# Patient Record
Sex: Female | Born: 1940 | Race: White | Hispanic: No | Marital: Married | Smoking: Former smoker
Health system: Southern US, Community
[De-identification: ages and names within clinical notes are randomized; demographics above are authoritative.]

## PROBLEM LIST (undated history)

## (undated) DIAGNOSIS — R079 Chest pain, unspecified: Secondary | ICD-10-CM

## (undated) DIAGNOSIS — D72820 Lymphocytosis (symptomatic): Secondary | ICD-10-CM

## (undated) DIAGNOSIS — D473 Essential (hemorrhagic) thrombocythemia: Secondary | ICD-10-CM

## (undated) DIAGNOSIS — IMO0002 Reserved for concepts with insufficient information to code with codable children: Secondary | ICD-10-CM

## (undated) DIAGNOSIS — I10 Essential (primary) hypertension: Secondary | ICD-10-CM

## (undated) DIAGNOSIS — E785 Hyperlipidemia, unspecified: Secondary | ICD-10-CM

## (undated) HISTORY — DX: Chest pain, unspecified: R07.9

## (undated) HISTORY — DX: Lymphocytosis (symptomatic): D72.820

## (undated) HISTORY — DX: Hyperlipidemia, unspecified: E78.5

## (undated) HISTORY — DX: Essential (primary) hypertension: I10

## (undated) HISTORY — DX: Reserved for concepts with insufficient information to code with codable children: IMO0002

## (undated) HISTORY — DX: Essential (hemorrhagic) thrombocythemia: D47.3

---

## 1960-02-04 HISTORY — PX: CHOLECYSTECTOMY: SHX55

## 1976-02-04 HISTORY — PX: VESICOVAGINAL FISTULA CLOSURE W/ TAH: SUR271

## 1999-03-15 ENCOUNTER — Ambulatory Visit (HOSPITAL_COMMUNITY): Admission: RE | Admit: 1999-03-15 | Discharge: 1999-03-15 | Payer: Self-pay | Admitting: Gastroenterology

## 1999-12-18 ENCOUNTER — Other Ambulatory Visit: Admission: RE | Admit: 1999-12-18 | Discharge: 1999-12-18 | Payer: Self-pay | Admitting: *Deleted

## 2001-06-22 ENCOUNTER — Other Ambulatory Visit: Admission: RE | Admit: 2001-06-22 | Discharge: 2001-06-22 | Payer: Self-pay | Admitting: *Deleted

## 2003-09-07 ENCOUNTER — Encounter: Admission: RE | Admit: 2003-09-07 | Discharge: 2003-09-07 | Payer: Self-pay | Admitting: Family Medicine

## 2004-03-25 ENCOUNTER — Ambulatory Visit (HOSPITAL_COMMUNITY): Admission: RE | Admit: 2004-03-25 | Discharge: 2004-03-25 | Payer: Self-pay | Admitting: Gastroenterology

## 2004-03-25 ENCOUNTER — Encounter (INDEPENDENT_AMBULATORY_CARE_PROVIDER_SITE_OTHER): Payer: Self-pay | Admitting: *Deleted

## 2006-07-08 ENCOUNTER — Ambulatory Visit: Payer: Self-pay | Admitting: Oncology

## 2006-08-26 ENCOUNTER — Ambulatory Visit: Payer: Self-pay | Admitting: Oncology

## 2006-08-27 LAB — COMPREHENSIVE METABOLIC PANEL
Alkaline Phosphatase: 69 U/L (ref 39–117)
BUN: 6 mg/dL (ref 6–23)
CO2: 24 mEq/L (ref 19–32)
Calcium: 9 mg/dL (ref 8.4–10.5)
Glucose, Bld: 105 mg/dL — ABNORMAL HIGH (ref 70–99)
Total Bilirubin: 0.6 mg/dL (ref 0.3–1.2)

## 2006-08-27 LAB — CBC & DIFF AND RETIC
BASO%: 1.2 % (ref 0.0–2.0)
LYMPH%: 36.1 % (ref 14.0–48.0)
MCV: 82 fL (ref 81.0–101.0)
MONO%: 6.3 % (ref 0.0–13.0)
NEUT#: 4.7 10*3/uL (ref 1.5–6.5)
Platelets: 396 10*3/uL (ref 145–400)
RDW: 10.9 % — ABNORMAL LOW (ref 11.3–14.5)
WBC: 8.6 10*3/uL (ref 3.9–10.0)

## 2006-08-27 LAB — CHCC SMEAR

## 2006-11-24 ENCOUNTER — Ambulatory Visit: Payer: Self-pay | Admitting: Oncology

## 2006-11-26 LAB — CBC WITH DIFFERENTIAL/PLATELET
Basophils Absolute: 0.1 10*3/uL (ref 0.0–0.1)
EOS%: 1.7 % (ref 0.0–7.0)
Eosinophils Absolute: 0.1 10*3/uL (ref 0.0–0.5)
HGB: 13.4 g/dL (ref 11.6–15.9)
LYMPH%: 37.5 % (ref 14.0–48.0)
MCH: 29.9 pg (ref 26.0–34.0)
MCHC: 35.8 g/dL (ref 32.0–36.0)
MONO#: 0.4 10*3/uL (ref 0.1–0.9)
MONO%: 5.8 % (ref 0.0–13.0)
NEUT#: 4.2 10*3/uL (ref 1.5–6.5)
RDW: 11.1 % — ABNORMAL LOW (ref 11.3–14.5)
WBC: 7.7 10*3/uL (ref 3.9–10.0)
lymph#: 2.9 10*3/uL (ref 0.9–3.3)

## 2006-11-30 LAB — COMPREHENSIVE METABOLIC PANEL
ALT: 15 U/L (ref 0–35)
Albumin: 4.3 g/dL (ref 3.5–5.2)
Creatinine, Ser: 0.72 mg/dL (ref 0.40–1.20)
Sodium: 138 mEq/L (ref 135–145)
Total Protein: 6.7 g/dL (ref 6.0–8.3)

## 2006-11-30 LAB — LACTATE DEHYDROGENASE: LDH: 132 U/L (ref 94–250)

## 2007-05-06 ENCOUNTER — Ambulatory Visit: Payer: Self-pay | Admitting: Oncology

## 2007-05-10 LAB — CBC WITH DIFFERENTIAL/PLATELET
BASO%: 0.6 % (ref 0.0–2.0)
EOS%: 2.5 % (ref 0.0–7.0)
HCT: UNDETERMINED % (ref 34.8–46.6)
HGB: 14.2 g/dL (ref 11.6–15.9)
LYMPH%: 32.1 % (ref 14.0–48.0)
MONO%: 8.5 % (ref 0.0–13.0)
NEUT%: 56.3 % (ref 39.6–76.8)
Platelets: 395 10*3/uL (ref 145–400)
RBC: UNDETERMINED 10*6/uL (ref 3.70–5.32)
RDW: UNDETERMINED % (ref 11.3–14.5)
WBC: 8.1 10*3/uL (ref 3.9–10.0)
lymph#: 2.6 10*3/uL (ref 0.9–3.3)

## 2010-05-21 ENCOUNTER — Institutional Professional Consult (permissible substitution): Payer: Self-pay | Admitting: Internal Medicine

## 2010-05-27 ENCOUNTER — Encounter: Payer: Self-pay | Admitting: Internal Medicine

## 2010-05-27 ENCOUNTER — Ambulatory Visit (INDEPENDENT_AMBULATORY_CARE_PROVIDER_SITE_OTHER): Payer: Medicare Other | Admitting: Internal Medicine

## 2010-05-27 VITALS — BP 120/74 | HR 72 | Temp 97.7°F | Ht 63.5 in | Wt 168.0 lb

## 2010-05-27 DIAGNOSIS — R0600 Dyspnea, unspecified: Secondary | ICD-10-CM

## 2010-05-27 DIAGNOSIS — R0989 Other specified symptoms and signs involving the circulatory and respiratory systems: Secondary | ICD-10-CM

## 2010-05-27 DIAGNOSIS — R0609 Other forms of dyspnea: Secondary | ICD-10-CM

## 2010-05-27 NOTE — Progress Notes (Signed)
  Subjective:    Patient ID: Cindy Keller, female    DOB: 01/03/1941, 70 y.o.   MRN: [id number]  HPI  45 yowf quit smoking 2011 with no resp problems (but not aerobically active then)  eval by Dr Anne Fu for doe/chest tightness and found to have hbp otherwise healthy and referred to pulmonary 05/27/2010 .   05/27/2010 Initial pulmonary/ Aairah Negrette  office eval with h/o sneezing nasal congestion x decades with good activity intolerance until spring of 2012 when started walking track for the first time early  in March 2012 assoc with chest tightness also assoc with new doe steps which she also noticed with the outdoor breathing difficulty and  curtailed the outdoor activity and   steps since onset of symptoms so "feeling better" on day of initial ov.  Chest tightness is also gone now that not as active,   Assoc with hoarsenss.  Ace started after onset of hoarseness.  Takes generic otcs antihistamines seem to help with pnds but no worse than usual symptoms.   Pt denies any significant sore throat, dysphagia, itching, sneezing,  nasal congestion or excess/ purulent secretions,  fever, chills, sweats, unintended wt loss, pleuritic or exertional cp, hempoptysis, orthopnea pnd or leg swelling.    Also denies any obvious fluctuation of symptoms with weather or environmental changes or other aggravating or alleviating factors.         Review of Systems  Constitutional: Negative for fever, chills and unexpected weight change.  HENT: Negative for ear pain, nosebleeds, congestion, sore throat, rhinorrhea, sneezing, trouble swallowing, dental problem, voice change, postnasal drip and sinus pressure.   Eyes: Negative for visual disturbance.  Respiratory: Positive for shortness of breath. Negative for cough and choking.   Cardiovascular: Negative for chest pain and leg swelling.  Gastrointestinal: Negative for vomiting, abdominal pain and diarrhea.  Genitourinary: Negative for difficulty urinating.    Musculoskeletal: Negative for arthralgias.  Skin: Negative for rash.  Neurological: Negative for tremors, syncope and headaches.  Hematological: Does not bruise/bleed easily.       Objective:   Physical Exam Pleasant amb wf nad  Wt 168 05/27/2010  HEENT mild turbinate edema.  Oropharynx no thrush or excess pnd or cobblestoning.  No JVD or cervical adenopathy. Mild accessory muscle hypertrophy. Trachea midline, nl thryroid. Chest was hyperinflated by percussion with diminished breath sounds and minimally increased exp time without wheeze. Hoover sign positive at very lated inspiration. Regular rate and rhythm without murmur gallop or rub or increase P2 or edema.  Abd: no hsm, nl excursion. Ext warm without cyanosis or clubbing.         Assessment & Plan:

## 2010-05-27 NOTE — Assessment & Plan Note (Addendum)
  When respiratory symptoms begin well after a patient reports complete smoking cessation,  it is very hard to "blame" COPD specifically or airways disorders in general  ie it doesn't make any more sense than hearing a  [employer] driver wrecked his car while driving his kids to school or a surgeon sliced his hand off carving roast beef (it must be rare indeed!)     That is to say, once the high risk activity stops,  the symptoms should not suddenly erupt or markedly worsen.  If so, the differential diagnosis should include  obesity/deconditioning,  LPR/Reflux/Aspiration syndromes,  occult CHF, or  especially side effect of medications commonly used in this population such as ACEI  Which would be a tempting dx x the ace was added after cards eval was complete and here symptoms are some better since then (although note she's not as active/ certainly the hoarseness is no worse).  For now rx with gerd diet and add ppi if not better then return for pft's

## 2010-05-27 NOTE — Patient Instructions (Addendum)
GERD (REFLUX)  is an extremely common cause of respiratory symptoms, many times with no significant heartburn at all.    It can be treated with medication, but also with lifestyle changes including avoidance of late meals, excessive alcohol, smoking cessation, and avoid fatty foods, chocolate, peppermint, colas, red wine, and acidic juices such as orange juice.  NO MINT OR MENTHOL PRODUCTS SO NO COUGH DROPS  USE SUGARLESS CANDY INSTEAD (jolley ranchers or Stover's)  NO OIL BASED VITAMINS   If your symptom worse go ahead and start Prilosec 20 mg Take 30-60 min before first meal of the day  And zyrtec 10 mg at bedtime   Please schedule a follow up office visit in 2 -4  weeks, sooner if needed with PFT's on return

## 2010-06-14 ENCOUNTER — Encounter: Payer: Self-pay | Admitting: Internal Medicine

## 2010-06-21 NOTE — Op Note (Signed)
NAMEMADALINE, LEFEBER             ACCOUNT NO.:  [id number]   MEDICAL RECORD NO.:  [id number]          PATIENT TYPE:  AMB   LOCATION:  ENDO                         FACILITY:  [facility]   PHYSICIAN:  James L. Malon Kindle., M.D.DATE OF BIRTH:  26-Apr-1940   DATE OF PROCEDURE:  DATE OF DISCHARGE:                                 OPERATIVE REPORT   PROCEDURE:  Colonoscopy with polypectomy.   MEDICATIONS:  Fentanyl 100 mcg and Versed 10 mg IV.   INDICATIONS FOR PROCEDURE:  The patient has had previous adenomatous colon  polyps.  This is done as a follow-up.   DESCRIPTION OF PROCEDURE:  The procedure had been explained to the patient  and consent obtained.  With the patient in the left lateral decubitus  position, the Olympus scope was inserted and advanced.  The prep was  excellent.  Using abdominal pressure and position changes, we were able to  reach the cecum.  The ileocecal valve and appendiceal orifice were seen.  The scope was withdrawn and the cecum, ascending colon, transverse colon and  descending colon were seen well at 50 cm from the anal verge.  In the distal  descending colon a 0.5 cm polyp was encountered and was removed with the  snare and sucked through the scope.  There was no bleeding.  The remainder  of the descending colon, sigmoid colon and rectum were normal.  No evidence  of further polyps.  The scope was withdrawn.  The patient tolerated the  procedure well.   ASSESSMENT:  Descending colon polyp removed, 211.3.   PLAN:  Routine post polypectomy instructions.  Will recommend repeating  procedure in five years and yearly Hemoccults.      JLE/MEDQ  D:  03/25/2004  T:  03/25/2004  Job:  [id number]   cc:   Talmadge Coventry, M.D.  [address]  [place]  [place] [address]  Fax: [phone]

## 2010-06-26 ENCOUNTER — Ambulatory Visit (INDEPENDENT_AMBULATORY_CARE_PROVIDER_SITE_OTHER)
Admission: RE | Admit: 2010-06-26 | Discharge: 2010-06-26 | Disposition: A | Discharge status: Home / Self Care | Payer: Medicare Other | Source: Ambulatory Visit | Attending: Internal Medicine | Admitting: Internal Medicine

## 2010-06-26 ENCOUNTER — Encounter: Payer: Self-pay | Admitting: Internal Medicine

## 2010-06-26 ENCOUNTER — Ambulatory Visit (INDEPENDENT_AMBULATORY_CARE_PROVIDER_SITE_OTHER): Payer: Medicare Other | Admitting: Internal Medicine

## 2010-06-26 VITALS — BP 118/70 | HR 74 | Temp 97.8°F | Ht 63.0 in | Wt 168.0 lb

## 2010-06-26 DIAGNOSIS — R0989 Other specified symptoms and signs involving the circulatory and respiratory systems: Secondary | ICD-10-CM

## 2010-06-26 DIAGNOSIS — R0609 Other forms of dyspnea: Secondary | ICD-10-CM

## 2010-06-26 DIAGNOSIS — R0600 Dyspnea, unspecified: Secondary | ICD-10-CM

## 2010-06-26 LAB — PULMONARY FUNCTION TEST

## 2010-06-26 NOTE — Progress Notes (Signed)
PFT done today. 

## 2010-06-26 NOTE — Progress Notes (Signed)
Quick Note:  Spoke with pt and notified of results per Dr. Wert. Pt verbalized understanding and denied any questions.  ______ 

## 2010-06-26 NOTE — Patient Instructions (Signed)
Try resuming your outdoor activities to see if you can tolerate them now  If so, ok with me to stop the reflux medications and see if it affects your exercise tolerance  Ace inhibitors (Lisinopril) have the potential to make allergy and asthma symptoms much worse than they would otherwise be (very similar to the effects of reflux) and if your allergies or breathing worsen the first step would be a trial off Lisinopril before considering additional allergy / asthma care  Your PFT's are essentially norma - you do not have significant copd   If you are satisfied with your treatment plan let your doctor know and he/she can either refill your medications or you can return here when your prescription runs out.     If in any way you are not 100% satisfied,  please tell us.  If 100% better, tell your friends!

## 2010-06-26 NOTE — Progress Notes (Signed)
  Subjective:    Patient ID: Cindy Keller, female    DOB: 02/21/1940, 70 y.o.   MRN: [id number]  HPI  [facility], newly on ace  70 yowf quit smoking 2011 with no resp problems (but not aerobically active then)  eval by Dr Anne Fu for doe/chest tightness and found to have hbp otherwise healthy and referred to pulmonary 05/27/2010 .   05/27/2010 Initial pulmonary/ Lazar Tierce  office eval with h/o sneezing nasal congestion x decades with good activity intolerance until spring of 2012 when started walking track for the first time early  in March 2012 assoc with chest tightness also assoc with new doe steps which she also noticed with the outdoor breathing difficulty and  curtailed the outdoor activity and   steps since onset of symptoms so "feeling better" on day of initial ov.  Chest tightness is also gone now that not as active,   Assoc with hoarsenss.  Ace started after onset of hoarseness.  Takes generic otcs antihistamines seem to help with pnds but no worse than usual symptoms.  Impression was ? Upper airway symptoms mimicking asthma / copd rec GERD (REFLUX) diet  If your symptom worse go ahead and start Prilosec 20 mg Take 30-60 min before first meal of the day  And zyrtec 10 mg at bedtime  06/26/2010 ov/Donivan Thammavong cc  Hoarseness when out in ams  O/w no cough, sob. Has not resumed outdoor ex yet like she was when her problem first started.  Sleeping ok without nocturnal  or early am exac of resp c/o's - Pt denies any significant sore throat, dysphagia, itching, sneezing,  nasal congestion or excess/ purulent secretions,  fever, chills, sweats, unintended wt loss, pleuritic or exertional cp, hempoptysis, orthopnea pnd or leg swelling.    Also denies any obvious fluctuation of symptoms with weather or environmental changes or other aggravating or alleviating factors.               Objective:   Physical Exam Pleasant amb wf nad  Wt 168 05/27/2010 > 168 06/26/2010   HEENT mild turbinate edema.   Oropharynx no thrush or excess pnd or cobblestoning.  No JVD or cervical adenopathy. Mild accessory muscle hypertrophy. Trachea midline, nl thryroid. Chest was hyperinflated by percussion with diminished breath sounds and minimally increased exp time without wheeze. Hoover sign positive at very lated inspiration. Regular rate and rhythm without murmur gallop or rub or increase P2 or edema.  Abd: no hsm, nl excursion. Ext warm without cyanosis or clubbing.         Assessment & Plan:

## 2010-06-26 NOTE — Assessment & Plan Note (Signed)
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   These are the same pts who not infrequently have failed to tolerate ace inhibitors,  dry powder inhalers or biphosphonates or report having reflux symptoms that don't respond to standard doses of PPI , and are easily confused as having aecopd or asthma flares,   GERD needs to be treated if present and ACE's avoided if her symptoms persist and they consideration for methacholine challenge to be sure this is not primary airways dz if still symptomatic while on GERD  RX 6 weeks off ACE - presently she feels better and is not inclined to change rx.  See instructions for specific recommendations which were reviewed directly with the patient who was given a copy with highlighter outlining the key components.

## 2010-07-05 ENCOUNTER — Encounter: Payer: Self-pay | Admitting: Internal Medicine

## 2013-11-26 ENCOUNTER — Encounter: Payer: Self-pay | Admitting: *Deleted

## 2014-12-06 ENCOUNTER — Emergency Department (HOSPITAL_COMMUNITY)
Admission: EM | Admit: 2014-12-06 | Discharge: 2014-12-06 | Disposition: A | Discharge status: Home / Self Care | Payer: Medicare Other | Attending: Emergency Medicine | Admitting: Emergency Medicine

## 2014-12-06 ENCOUNTER — Emergency Department (HOSPITAL_COMMUNITY): Payer: Medicare Other

## 2014-12-06 ENCOUNTER — Encounter (HOSPITAL_COMMUNITY): Payer: Self-pay | Admitting: Emergency Medicine

## 2014-12-06 DIAGNOSIS — Z79899 Other long term (current) drug therapy: Secondary | ICD-10-CM | POA: Insufficient documentation

## 2014-12-06 DIAGNOSIS — Z87448 Personal history of other diseases of urinary system: Secondary | ICD-10-CM | POA: Diagnosis not present

## 2014-12-06 DIAGNOSIS — Z87891 Personal history of nicotine dependence: Secondary | ICD-10-CM | POA: Insufficient documentation

## 2014-12-06 DIAGNOSIS — Z8639 Personal history of other endocrine, nutritional and metabolic disease: Secondary | ICD-10-CM | POA: Diagnosis not present

## 2014-12-06 DIAGNOSIS — R079 Chest pain, unspecified: Secondary | ICD-10-CM | POA: Diagnosis not present

## 2014-12-06 DIAGNOSIS — Z9049 Acquired absence of other specified parts of digestive tract: Secondary | ICD-10-CM | POA: Insufficient documentation

## 2014-12-06 DIAGNOSIS — R1013 Epigastric pain: Secondary | ICD-10-CM | POA: Insufficient documentation

## 2014-12-06 DIAGNOSIS — I10 Essential (primary) hypertension: Secondary | ICD-10-CM | POA: Diagnosis not present

## 2014-12-06 LAB — CBC
HCT: 33.7 % — ABNORMAL LOW (ref 36.0–46.0)
Hemoglobin: 12.4 g/dL (ref 12.0–15.0)
MCH: 30 pg (ref 26.0–34.0)
MCHC: 36.8 g/dL — ABNORMAL HIGH (ref 30.0–36.0)
MCV: 81.4 fL (ref 78.0–100.0)
PLATELETS: 195 10*3/uL (ref 150–400)
RBC: 4.14 MIL/uL (ref 3.87–5.11)
RDW: 14.3 % (ref 11.5–15.5)
WBC: 5.2 10*3/uL (ref 4.0–10.5)

## 2014-12-06 LAB — I-STAT TROPONIN, ED: Troponin i, poc: 0 ng/mL (ref 0.00–0.08)

## 2014-12-06 LAB — BASIC METABOLIC PANEL
Anion gap: 10 (ref 5–15)
BUN: 8 mg/dL (ref 6–20)
CALCIUM: 9.2 mg/dL (ref 8.9–10.3)
CO2: 23 mmol/L (ref 22–32)
CREATININE: 0.81 mg/dL (ref 0.44–1.00)
Chloride: 106 mmol/L (ref 101–111)
GFR calc Af Amer: >60 mL/min (ref >60)
Glucose, Bld: 116 mg/dL — ABNORMAL HIGH (ref 65–99)
Potassium: 4.2 mmol/L (ref 3.5–5.1)
SODIUM: 139 mmol/L (ref 135–145)

## 2014-12-06 MED ORDER — OMEPRAZOLE 20 MG PO CPDR: 20.0000 mg | DELAYED_RELEASE_CAPSULE | Freq: Every day | ORAL | Status: DC

## 2014-12-06 MED ORDER — GI COCKTAIL ~~LOC~~: 30.0000 mL | Freq: Once | ORAL | Status: DC

## 2014-12-06 MED ORDER — PANTOPRAZOLE SODIUM 40 MG IV SOLR
40.0000 mg | Freq: Once | INTRAVENOUS | Status: AC
  Administered 2014-12-06: 40 mg via INTRAVENOUS
  Filled 2014-12-06: qty 40

## 2014-12-06 NOTE — Discharge Instructions (Signed)
Nonspecific Chest Pain  °Chest pain can be caused by many different conditions. There is always a chance that your pain could be related to something serious, such as a heart attack or a blood clot in your lungs. Chest pain can also be caused by conditions that are not life-threatening. If you have chest pain, it is very important to follow up with your health care provider. °CAUSES  °Chest pain can be caused by: °· Heartburn. °· Pneumonia or bronchitis. °· Anxiety or stress. °· Inflammation around your heart (pericarditis) or lung (pleuritis or pleurisy). °· A blood clot in your lung. °· A collapsed lung (pneumothorax). It can develop suddenly on its own (spontaneous pneumothorax) or from trauma to the chest. °· Shingles infection (varicella-zoster virus). °· Heart attack. °· Damage to the bones, muscles, and cartilage that make up your chest wall. This can include: °¨ Bruised bones due to injury. °¨ Strained muscles or cartilage due to frequent or repeated coughing or overwork. °¨ Fracture to one or more ribs. °¨ Sore cartilage due to inflammation (costochondritis). °RISK FACTORS  °Risk factors for chest pain may include: °· Activities that increase your risk for trauma or injury to your chest. °· Respiratory infections or conditions that cause frequent coughing. °· Medical conditions or overeating that can cause heartburn. °· Heart disease or family history of heart disease. °· Conditions or health behaviors that increase your risk of developing a blood clot. °· Having had chicken pox (varicella zoster). °SIGNS AND SYMPTOMS °Chest pain can feel like: °· Burning or tingling on the surface of your chest or deep in your chest. °· Crushing, pressure, aching, or squeezing pain. °· Dull or sharp pain that is worse when you move, cough, or take a deep breath. °· Pain that is also felt in your back, neck, shoulder, or arm, or pain that spreads to any of these areas. °Your chest pain may come and go, or it may stay  constant. °DIAGNOSIS °Lab tests or other studies may be needed to find the cause of your pain. Your health care provider may have you take a test called an ambulatory ECG (electrocardiogram). An ECG records your heartbeat patterns at the time the test is performed. You may also have other tests, such as: °· Transthoracic echocardiogram (TTE). During echocardiography, sound waves are used to create a picture of all of the heart structures and to look at how blood flows through your heart. °· Transesophageal echocardiogram (TEE). This is a more advanced imaging test that obtains images from inside your body. It allows your health care provider to see your heart in finer detail. °· Cardiac monitoring. This allows your health care provider to monitor your heart rate and rhythm in real time. °· Holter monitor. This is a portable device that records your heartbeat and can help to diagnose abnormal heartbeats. It allows your health care provider to track your heart activity for several days, if needed. °· Stress tests. These can be done through exercise or by taking medicine that makes your heart beat more quickly. °· Blood tests. °· Imaging tests. °TREATMENT  °Your treatment depends on what is causing your chest pain. Treatment may include: °· Medicines. These may include: °¨ Acid blockers for heartburn. °¨ Anti-inflammatory medicine. °¨ Pain medicine for inflammatory conditions. °¨ Antibiotic medicine, if an infection is present. °¨ Medicines to dissolve blood clots. °¨ Medicines to treat coronary artery disease. °· Supportive care for conditions that do not require medicines. This may include: °¨ Resting. °¨ Applying heat   or cold packs to injured areas. °¨ Limiting activities until pain decreases. °HOME CARE INSTRUCTIONS °· If you were prescribed an antibiotic medicine, finish it all even if you start to feel better. °· Avoid any activities that bring on chest pain. °· Do not use any tobacco products, including  cigarettes, chewing tobacco, or electronic cigarettes. If you need help quitting, ask your health care provider. °· Do not drink alcohol. °· Take medicines only as directed by your health care provider. °· Keep all follow-up visits as directed by your health care provider. This is important. This includes any further testing if your chest pain does not go away. °· If heartburn is the cause for your chest pain, you may be told to keep your head raised (elevated) while sleeping. This reduces the chance that acid will go from your stomach into your esophagus. °· Make lifestyle changes as directed by your health care provider. These may include: °¨ Getting regular exercise. Ask your health care provider to suggest some activities that are safe for you. °¨ Eating a heart-healthy diet. A registered dietitian can help you to learn healthy eating options. °¨ Maintaining a healthy weight. °¨ Managing diabetes, if necessary. °¨ Reducing stress. °SEEK MEDICAL CARE IF: °· Your chest pain does not go away after treatment. °· You have a rash with blisters on your chest. °· You have a fever. °SEEK IMMEDIATE MEDICAL CARE IF:  °· Your chest pain is worse. °· You have an increasing cough, or you cough up blood. °· You have severe abdominal pain. °· You have severe weakness. °· You faint. °· You have chills. °· You have sudden, unexplained chest discomfort. °· You have sudden, unexplained discomfort in your arms, back, neck, or jaw. °· You have shortness of breath at any time. °· You suddenly start to sweat, or your skin gets clammy. °· You feel nauseous or you vomit. °· You suddenly feel light-headed or dizzy. °· Your heart begins to beat quickly, or it feels like it is skipping beats. °These symptoms may represent a serious problem that is an emergency. Do not wait to see if the symptoms will go away. Get medical help right away. Call your local emergency services (911 in the [place] Do not drive yourself to the hospital. °  °This  information is not intended to replace advice given to you by your health care provider. Make sure you discuss any questions you have with your health care provider. °  °Document Released: 10/30/2004 Document Revised: 02/10/2014 Document Reviewed: 08/26/2013 °Elsevier Interactive Patient Education ©2016 [employer] ° °

## 2014-12-06 NOTE — ED Provider Notes (Signed)
Complains of epigastric pain nonradiating onset 2 days after eating a taco salad pain is nonradiating. No chest pain not made worse with exertion no nausea or vomiting feels like hiatal hernia she's had in the past she was treated her primary care physician with GI cocktail, without relief . EMS treated patient with sublingual nitroglycerin and 324 mg of aspirin, without relief. Discomfort is presently mild. Doubt acute coronary syndrome highly atypical symptoms. Nonacute EKG. Negative troponin after 2 days of symptoms  Orlie Dakin, MD 12/06/14 [phone]

## 2014-12-06 NOTE — ED Provider Notes (Signed)
CSN: [id number]     Arrival date & time 12/06/14  1206 History   First MD Initiated Contact with Patient 12/06/14 1234     Chief Complaint  Patient presents with  . Chest Pain     (Consider location/radiation/quality/duration/timing/severity/associated sxs/prior Treatment) HPI Comments: Patient presents to the emergency department with chief complaint of chest pain. Patient states that her symptoms began on Monday. She states that the pain is mostly in the epigastric region. She denies any associated shortness of breath. Denies radiating symptoms. Denies any nausea, vomiting, or abdominal pain. Patient was given 324 of aspirin and for nitroglycerin tablets. She rates the pain as a 2 out of 10. There are no exacerbating factors. Patient states that the symptoms began after eating a taco salad on Monday. She states that it feels like GERD.  Denies any hx of ACS, PE, DVT.  The history is provided by the patient. No language interpreter was used.    Past Medical History  Diagnosis Date  . Hypertension   . Hyperlipidemia   . Chest pain   . Dyspareunia    Past Surgical History  Procedure Laterality Date  . Cholecystectomy  1962  . Vesicovaginal fistula closure w/ tah  1978   Family History  Problem Relation Age of Onset  . Allergies Mother   . Heart disease Father   . Heart disease Mother   . Breast cancer Sister    Social History  Substance Use Topics  . Smoking status: Former Smoker -- 0.50 packs/day for 30 years    Types: Cigarettes    Quit date: 05/04/2009  . Smokeless tobacco: Never Used  . Alcohol Use: No   OB History    No data available     Review of Systems  Constitutional: Negative for fever and chills.  Respiratory: Negative for shortness of breath.   Cardiovascular: Positive for chest pain.  Gastrointestinal: Negative for nausea, vomiting, diarrhea and constipation.  Genitourinary: Negative for dysuria.  All other systems reviewed and are  negative.     Allergies  Benadryl; Codeine; and Ciprofloxacin  Home Medications   Prior to Admission medications   Medication Sig Start Date End Date Taking? Authorizing Provider  Ascorbic Acid (VITAMIN C) 500 MG tablet Take 500 mg by mouth daily.      Historical Provider, MD  Calcium Carbonate-Vit D-Min 1200-1000 MG-UNIT CHEW Chew 1 capsule by mouth daily.      Historical Provider, MD  Cholecalciferol (VITAMIN D) 1000 UNITS capsule Take 1,000 Units by mouth daily.      Historical Provider, MD  losartan (COZAAR) 100 MG tablet Take 100 mg by mouth daily.    Historical Provider, MD  Magnesium 250 MG TABS Take 1 tablet by mouth daily.      Historical Provider, MD  Multiple Vitamins-Minerals (MULTIVITAMIN WITH MINERALS) tablet Take 1 tablet by mouth daily.      Historical Provider, MD  Omega-3 Fatty Acids (FISH OIL) 1000 MG CAPS Take 1 capsule by mouth daily.      Historical Provider, MD  vitamin E 200 UNIT capsule Take 200 Units by mouth daily.      Historical Provider, MD   BP 146/69 mmHg  Pulse 80  Temp(Src) 98.1 F (36.7 C)  Resp 16  Ht 5\' 3"  (1.6 m)  Wt 175 lb (79.379 kg)  BMI 31.01 kg/m2  SpO2 98% Physical Exam  Constitutional: She is oriented to person, place, and time. She appears well-developed and well-nourished.  HENT:  Head: Normocephalic  and atraumatic.  Eyes: Conjunctivae and EOM are normal. Pupils are equal, round, and reactive to light.  Neck: Normal range of motion. Neck supple.  Cardiovascular: Normal rate and regular rhythm.  Exam reveals no gallop and no friction rub.   No murmur heard. Pulmonary/Chest: Effort normal and breath sounds normal. No respiratory distress. She has no wheezes. She has no rales. She exhibits no tenderness.  Abdominal: Soft. Bowel sounds are normal. She exhibits no distension and no mass. There is no tenderness. There is no rebound and no guarding.  Musculoskeletal: Normal range of motion. She exhibits no edema or tenderness.   Neurological: She is alert and oriented to person, place, and time.  Skin: Skin is warm and dry.  Psychiatric: She has a normal mood and affect. Her behavior is normal. Judgment and thought content normal.  Nursing note and vitals reviewed.   ED Course  Procedures (including critical care time) Results for orders placed or performed during the hospital encounter of 09/47/09  Basic metabolic panel  Result Value Ref Range   Sodium 139 135 - 145 mmol/L   Potassium 4.2 3.5 - 5.1 mmol/L   Chloride 106 101 - 111 mmol/L   CO2 23 22 - 32 mmol/L   Glucose, Bld 116 (H) 65 - 99 mg/dL   BUN 8 6 - 20 mg/dL   Creatinine, Ser 0.81 0.44 - 1.00 mg/dL   Calcium 9.2 8.9 - 10.3 mg/dL   GFR calc non Af Amer >60 >60 mL/min   GFR calc Af Amer >60 >60 mL/min   Anion gap 10 5 - 15  CBC  Result Value Ref Range   WBC 5.2 4.0 - 10.5 K/uL   RBC 4.14 3.87 - 5.11 MIL/uL   Hemoglobin 12.4 12.0 - 15.0 g/dL   HCT 33.7 (L) 36.0 - 46.0 %   MCV 81.4 78.0 - 100.0 fL   MCH 30.0 26.0 - 34.0 pg   MCHC 36.8 (H) 30.0 - 36.0 g/dL   RDW 14.3 11.5 - 15.5 %   Platelets 195 150 - 400 K/uL  I-stat troponin, ED (not at [facility], [facility])  Result Value Ref Range   Troponin i, poc 0.00 0.00 - 0.08 ng/mL   Comment 3           Dg Chest 2 View  12/06/2014  CLINICAL DATA:  Central chest pain EXAM: CHEST  2 VIEW COMPARISON:  06/26/2010 FINDINGS: Cardiomediastinal silhouette is stable. No acute infiltrate or pleural effusion. No pulmonary edema. Mild degenerative changes mid thoracic spine. IMPRESSION: No active cardiopulmonary disease. Electronically Signed   By: Lahoma Crocker M.D.   On: 12/06/2014 13:07    I have personally reviewed and evaluated these images and lab results as part of my medical decision-making.   EKG Interpretation   Date/Time:  Wednesday December 06 2014 12:21:14 EDT Ventricular Rate:  83 PR Interval:  151 QRS Duration: 71 QT Interval:  367 QTC Calculation: 431 R Axis:   -19 Text Interpretation:  Sinus  rhythm Borderline left axis deviation  Borderline T abnormalities, anterior leads Baseline wander in lead(s) V1  No old tracing to compare Confirmed by Winfred Leeds  MD, SAM [phone]) on  12/06/2014 2:14:16 PM      MDM   Final diagnoses:  Chest pain, unspecified chest pain type    Patient presents with chest pain. Pain is mostly in the epigastrium. Symptoms started after eating taco salad on Monday. Symptoms been persistent. Mild improvement with GI cocktail at PCP office today. No  history of ACS, PE, or DVT. Will check basic labs. Will reassess.   HEART score is 3.  Doubt ACS. Improved symptoms with protonix.  EKG is reassuring.    Patient seen by and discussed with Dr. Winfred Leeds, who agrees with plan for discharge.  Will treat with omeprazole.  PCP follow-up.   Montine Circle, PA-C 12/06/14 1533  Orlie Dakin, MD 12/06/14 [phone]

## 2014-12-06 NOTE — ED Notes (Signed)
Per EMS: pt from PCP for eval of epigastric pressure, pt states began on Monday after eating a taco salad. Pt denies any n/v/d or radiation of pressure. Pt states PCP gave GI cocktail with minimal relief at PCP. EMS called for further eval and pt given 3247 mg of aspirin and 4 nitro pills. Pt states 2/10 pain at this time. Denies any cardiac problems. nad noted, skin warm and dry.

## 2015-09-07 ENCOUNTER — Other Ambulatory Visit: Payer: Self-pay | Admitting: Gastroenterology

## 2016-09-16 ENCOUNTER — Other Ambulatory Visit: Payer: Self-pay | Admitting: Family

## 2016-09-16 DIAGNOSIS — R7989 Other specified abnormal findings of blood chemistry: Secondary | ICD-10-CM

## 2016-09-17 ENCOUNTER — Other Ambulatory Visit (HOSPITAL_COMMUNITY)
Admission: RE | Admit: 2016-09-17 | Discharge: 2016-09-17 | Disposition: A | Discharge status: Home / Self Care | Payer: Medicare Other | Source: Ambulatory Visit | Attending: Hematology & Oncology | Admitting: Hematology & Oncology

## 2016-09-17 ENCOUNTER — Ambulatory Visit (HOSPITAL_BASED_OUTPATIENT_CLINIC_OR_DEPARTMENT_OTHER): Payer: Medicare Other | Admitting: Family

## 2016-09-17 ENCOUNTER — Ambulatory Visit: Payer: Medicare Other

## 2016-09-17 ENCOUNTER — Ambulatory Visit (HOSPITAL_BASED_OUTPATIENT_CLINIC_OR_DEPARTMENT_OTHER): Payer: Medicare Other

## 2016-09-17 ENCOUNTER — Other Ambulatory Visit (HOSPITAL_BASED_OUTPATIENT_CLINIC_OR_DEPARTMENT_OTHER): Payer: Medicare Other

## 2016-09-17 VITALS — BP 132/65 | HR 89 | Temp 98.4°F | Resp 16 | Ht 62.0 in | Wt 169.0 lb

## 2016-09-17 DIAGNOSIS — D72820 Lymphocytosis (symptomatic): Secondary | ICD-10-CM | POA: Diagnosis not present

## 2016-09-17 DIAGNOSIS — D591 Other autoimmune hemolytic anemias: Secondary | ICD-10-CM | POA: Diagnosis not present

## 2016-09-17 DIAGNOSIS — E785 Hyperlipidemia, unspecified: Secondary | ICD-10-CM | POA: Diagnosis not present

## 2016-09-17 DIAGNOSIS — R7989 Other specified abnormal findings of blood chemistry: Secondary | ICD-10-CM

## 2016-09-17 DIAGNOSIS — D5912 Cold autoimmune hemolytic anemia: Secondary | ICD-10-CM

## 2016-09-17 DIAGNOSIS — I10 Essential (primary) hypertension: Secondary | ICD-10-CM | POA: Diagnosis not present

## 2016-09-17 LAB — CHCC SATELLITE - SMEAR

## 2016-09-17 LAB — COMPREHENSIVE METABOLIC PANEL
ALT: 12 U/L (ref 0–55)
ANION GAP: 8 meq/L (ref 3–11)
AST: 22 U/L (ref 5–34)
Albumin: 4.3 g/dL (ref 3.5–5.0)
Alkaline Phosphatase: 71 U/L (ref 40–150)
BILIRUBIN TOTAL: 0.87 mg/dL (ref 0.20–1.20)
BUN: 8.6 mg/dL (ref 7.0–26.0)
CO2: 26 meq/L (ref 22–29)
CREATININE: 0.9 mg/dL (ref 0.6–1.1)
Calcium: 9.8 mg/dL (ref 8.4–10.4)
Chloride: 103 mEq/L (ref 98–109)
EGFR: 61 mL/min/{1.73_m2} — ABNORMAL LOW (ref >90)
GLUCOSE: 96 mg/dL (ref 70–140)
Potassium: 4.5 mEq/L (ref 3.5–5.1)
Sodium: 137 mEq/L (ref 136–145)
TOTAL PROTEIN: 7.3 g/dL (ref 6.4–8.3)

## 2016-09-17 LAB — CBC WITH DIFFERENTIAL (CANCER CENTER ONLY)
HEMATOCRIT: 36 % (ref 34.8–46.6)
HEMOGLOBIN: 12.9 g/dL (ref 11.6–15.9)
MCH: 28.2 pg (ref 26.0–34.0)
MCHC: 35.8 g/dL (ref 32.0–36.0)
MCV: 79 fL — ABNORMAL LOW (ref 81–101)
Platelets: 244 10*3/uL (ref 145–400)
RBC: 4.58 10*6/uL (ref 3.70–5.32)
RDW: 17.8 % — ABNORMAL HIGH (ref 11.1–15.7)
WBC: 4.6 10*3/uL (ref 3.9–10.0)

## 2016-09-17 LAB — MANUAL DIFFERENTIAL (CHCC SATELLITE)
ALC: 3.5 10*3/uL — ABNORMAL HIGH (ref 0.6–2.2)
ANC (CHCC MAN DIFF): 0.6 10*3/uL — AB (ref 1.5–6.7)
BASO: 1 % (ref 0–2)
LYMPH: 76 % — ABNORMAL HIGH (ref 14–48)
MONO: 10 % (ref 0–13)
PLT EST ~~LOC~~: ADEQUATE
RBC COMMENTS: NORMAL
SEG: 13 % — ABNORMAL LOW (ref 40–75)

## 2016-09-17 LAB — LACTATE DEHYDROGENASE: LDH: 235 U/L (ref 125–245)

## 2016-09-17 MED ORDER — FOLIC ACID 1 MG PO TABS: 1.0000 mg | ORAL_TABLET | Freq: Every day | ORAL | 11 refills | Status: DC

## 2016-09-17 NOTE — Progress Notes (Signed)
Hematology/Oncology Consultation   Name: Cindy Keller      MRN: [id number]    Location: Room/bed info not found  Date: 09/17/2016 Time:10:25 AM   REFERRING PHYSICIAN: Leighton Ruff, MD  REASON FOR CONSULT: Other specified abnormal lab findings of blood chemistry   DIAGNOSIS:  1. Cold agglutinin disease   HISTORY OF PRESENT ILLNESS: Cindy Keller is a very pleasant 76 yo caucasian female who recently received a letter from the [facility] stating she has cold agglutinins. Her labs today had to be warmed prior to running. Titer is pending.  She is asymptomatic and has no complaints at this time.  No lymphadenopathy found on exam. No episodes of bleeding, bruising or petechiae.  On her smear she has an elevated lymphocyte count with variant lymphocytes.  No fever, chills, n/v, cough, rash, dizziness, SOB, chest pain, palpitations, abdominal pain or changes in bowel or bladder habits. She has occasional diarrhea.  She has occasional numbness and tingling occasionally in her hands that comes and goes. She has history of osteoarthritis.  No swelling or tenderness in her extremities. No c/o pain.  She had her gallbladder removed 3 years ago due to stones. She had several other family members that also had to have their gallbladders removed. She had 2 children. No history of miscarriage.  She had a daughter with uterine cancer, sister with breast and paternal aunt with breast cancer.  She has no personal cancer history.  She states that her leukocytosis later than life.  She quit smoking in 2001. She does not drink alcohol.   ROS: All other 10 point review of systems is negative.   PAST MEDICAL HISTORY:   Past Medical History:  Diagnosis Date  . Chest pain   . Dyspareunia   . Hyperlipidemia   . Hypertension     ALLERGIES: Allergies  Allergen Reactions  . Benadryl [Diphenhydramine Hcl]     restless  . Codeine     restless  . Ciprofloxacin Rash      MEDICATIONS:  Current  Outpatient Prescriptions on File Prior to Visit  Medication Sig Dispense Refill  . Ascorbic Acid (VITAMIN C) 500 MG tablet Take 500 mg by mouth daily.      Marland Kitchen aspirin EC 81 MG tablet Take 81 mg by mouth daily.    . Calcium Carbonate-Vit D-Min 1200-1000 MG-UNIT CHEW Chew 1 capsule by mouth daily.      . Cholecalciferol (VITAMIN D) 1000 UNITS capsule Take 1,000 Units by mouth daily.      Marland Kitchen losartan (COZAAR) 100 MG tablet Take 100 mg by mouth daily.    . Multiple Vitamins-Minerals (MULTIVITAMIN WITH MINERALS) tablet Take 1 tablet by mouth daily.      . Omega-3 Fatty Acids (FISH OIL) 1000 MG CAPS Take 1 capsule by mouth daily.      Marland Kitchen omeprazole (PRILOSEC) 20 MG capsule Take 1 capsule (20 mg total) by mouth daily. 30 capsule 0   No current facility-administered medications on file prior to visit.      PAST SURGICAL HISTORY Past Surgical History:  Procedure Laterality Date  . CHOLECYSTECTOMY  1962  . VESICOVAGINAL FISTULA CLOSURE W/ TAH  1978    FAMILY HISTORY: Family History  Problem Relation Age of Onset  . Allergies Mother   . Heart disease Father   . Heart disease Mother   . Breast cancer Sister     SOCIAL HISTORY:  reports that she quit smoking about 7 years ago. Her smoking use included Cigarettes.  She has a 15.00 pack-year smoking history. She has never used smokeless tobacco. She reports that she does not drink alcohol or use drugs.  PERFORMANCE STATUS: The patient's performance status is 0 - Asymptomatic  PHYSICAL EXAM: Most Recent Vital Signs: There were no vitals taken for this visit. BP 132/65 (BP Location: Left Arm, Patient Position: Sitting)   Pulse 89   Temp 98.4 F (36.9 C) (Oral)   Resp 16   Ht '5\' 2"'  (1.575 m)   Wt 169 lb (76.7 kg)   SpO2 99%   BMI 30.91 kg/m   General Appearance:    Alert, cooperative, no distress, appears stated age  Head:    Normocephalic, without obvious abnormality, atraumatic  Eyes:    PERRL, conjunctiva/corneas clear, EOM's intact,  fundi    benign, both eyes        Throat:   Lips, mucosa, and tongue normal; teeth and gums normal  Neck:   Supple, symmetrical, trachea midline, no adenopathy;    thyroid:  no enlargement/tenderness/nodules; no carotid   bruit or JVD  Back:     Symmetric, no curvature, ROM normal, no CVA tenderness  Lungs:     Clear to auscultation bilaterally, respirations unlabored  Chest Wall:    No tenderness or deformity   Heart:    Regular rate and rhythm, S1 and S2 normal, no murmur, rub   or gallop     Abdomen:     Soft, non-tender, bowel sounds active all four quadrants,    no masses, no organomegaly        Extremities:   Extremities normal, atraumatic, no cyanosis or edema  Pulses:   2+ and symmetric all extremities  Skin:   Skin color, texture, turgor normal, no rashes or lesions  Lymph nodes:   Cervical, supraclavicular, and axillary nodes normal       LABORATORY DATA:  No results found for this or any previous visit (from the past 48 hour(s)).    RADIOGRAPHY: No results found.     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 Titer, flow cytometry and hemoglobin evaluation are pending. She is asymptomatic at this time. No lymphadenopathy found on exam.  We will get the rest of her lab work and determine if she needs CT scan and possibly even a bone marrow biopsy.  She will go ahead and start taking folic acid 1 mg PO daily.  We will go ahead and plan to see her back again in another month for follow-up and lab.   All questions were answered and she is in agreement with the plan. She will contact our office with any questions or concerns. We can certainly see her much sooner if necessary.  She was discussed with and also seen by Dr. Marin Olp and he is in agreement with the aforementioned.   [facility] M     Addendum:  I saw and examined the patient with Cindy Keller. This is incredibly interesting. I have  to suspect that she has an underlying lymphoproliferative disorder. Possibly, CLL or some low-grade lymphoma might be the source.  [name] was smear, I would think that she has an underlying lymphoproliferative disorder. She has an increase in lymphocytes. She has some large atypical-appearing lymphocytes. On the un-warmed blood smear, she has incredible agglutination.  Her physical exam is pretty much unremarkable for any lymphadenopathy. I don't detect any obvious splenomegaly.  It will be incredibly interesting to see what  her cold agglutinin titer is.  We will get her on folic acid. I think this will help out.  Since it is summertime, if she has cold agglutinin disease, I would not expect her to have significant anemia. However, with the cooler months, then, I think that anemia might be more of an issue.  We spent about 50 minutes with her. Again she is very nice. This is a very interesting problem.  We will see her back in 3-4 weeks.  She may need a bone marrow test at some point. We may consider a CT scan.  Lattie Haw, MD

## 2016-09-18 LAB — COLD AGGLUTININ TITER: Cold Agglutinin Titer, Quant: NEGATIVE

## 2016-09-18 LAB — RETICULOCYTES: Reticulocyte Count: 2.5 % (ref 0.6–2.6)

## 2016-09-22 LAB — HEMOGLOBINOPATHY EVALUATION
HEMOGLOBIN F QUANTITATION: 0 % (ref 0.0–2.0)
HGB C: 37.6 % — AB
HGB S: 0 %
HGB VARIANT: 0 %
Hemoglobin A2 Quantitation: 3.1 % (ref 1.8–3.2)
Hgb A: 59.3 % — ABNORMAL LOW (ref 96.4–98.8)

## 2016-09-22 LAB — FLOW CYTOMETRY - CHCC SATELLITE

## 2016-10-15 ENCOUNTER — Ambulatory Visit (HOSPITAL_BASED_OUTPATIENT_CLINIC_OR_DEPARTMENT_OTHER): Payer: Medicare Other | Admitting: Family

## 2016-10-15 ENCOUNTER — Other Ambulatory Visit (HOSPITAL_BASED_OUTPATIENT_CLINIC_OR_DEPARTMENT_OTHER): Payer: Medicare Other

## 2016-10-15 VITALS — BP 137/88 | HR 85 | Temp 98.1°F | Resp 18 | Wt 168.4 lb

## 2016-10-15 DIAGNOSIS — D72820 Lymphocytosis (symptomatic): Secondary | ICD-10-CM | POA: Diagnosis not present

## 2016-10-15 DIAGNOSIS — D479 Neoplasm of uncertain behavior of lymphoid, hematopoietic and related tissue, unspecified: Secondary | ICD-10-CM | POA: Diagnosis not present

## 2016-10-15 DIAGNOSIS — D591 Other autoimmune hemolytic anemias: Secondary | ICD-10-CM | POA: Diagnosis not present

## 2016-10-15 DIAGNOSIS — C9110 Chronic lymphocytic leukemia of B-cell type not having achieved remission: Secondary | ICD-10-CM

## 2016-10-15 DIAGNOSIS — D5912 Cold autoimmune hemolytic anemia: Secondary | ICD-10-CM

## 2016-10-15 LAB — CMP (CANCER CENTER ONLY)
ALBUMIN: 4 g/dL (ref 3.3–5.5)
ALT(SGPT): 21 U/L (ref 10–47)
AST: 27 U/L (ref 11–38)
Alkaline Phosphatase: 62 U/L (ref 26–84)
BUN, Bld: 8 mg/dL (ref 7–22)
CHLORIDE: 105 meq/L (ref 98–108)
CO2: 28 meq/L (ref 18–33)
CREATININE: 1 mg/dL (ref 0.6–1.2)
Calcium: 9.4 mg/dL (ref 8.0–10.3)
Glucose, Bld: 103 mg/dL (ref 73–118)
Potassium: 4.6 mEq/L (ref 3.3–4.7)
SODIUM: 141 meq/L (ref 128–145)
Total Bilirubin: 1 mg/dl (ref 0.20–1.60)
Total Protein: 7 g/dL (ref 6.4–8.1)

## 2016-10-15 LAB — CBC WITH DIFFERENTIAL (CANCER CENTER ONLY)
BASO#: 0.2 10*3/uL (ref 0.0–0.2)
BASO%: 3.9 % — ABNORMAL HIGH (ref 0.0–2.0)
EOS ABS: 0 10*3/uL (ref 0.0–0.5)
EOS%: 0.2 % (ref 0.0–7.0)
HCT: 35.4 % (ref 34.8–46.6)
HEMOGLOBIN: 12.9 g/dL (ref 11.6–15.9)
LYMPH#: 3.5 10*3/uL — ABNORMAL HIGH (ref 0.9–3.3)
LYMPH%: 68.5 % — AB (ref 14.0–48.0)
MCH: 28.4 pg (ref 26.0–34.0)
MCHC: 36.4 g/dL — ABNORMAL HIGH (ref 32.0–36.0)
MCV: 78 fL — ABNORMAL LOW (ref 81–101)
MONO#: 0.8 10*3/uL (ref 0.1–0.9)
MONO%: 16.3 % — AB (ref 0.0–13.0)
NEUT#: 0.6 10*3/uL — ABNORMAL LOW (ref 1.5–6.5)
NEUT%: 11.1 % — ABNORMAL LOW (ref 39.6–80.0)
PLATELETS: 250 10*3/uL (ref 145–400)
RBC: 4.54 10*6/uL (ref 3.70–5.32)
RDW: 17.9 % — ABNORMAL HIGH (ref 11.1–15.7)
WBC: 5.1 10*3/uL (ref 3.9–10.0)

## 2016-10-15 MED ORDER — DIAZEPAM 5 MG PO TABS: 5.0000 mg | ORAL_TABLET | Freq: Once | ORAL | 0 refills | Status: AC

## 2016-10-15 NOTE — Progress Notes (Signed)
Hematology and Oncology Follow Up Visit  Cindy Keller [id number] 1940/11/03 76 y.o. 10/15/2016   Principle Diagnosis:  Cold agglutinin disease  Currently working up for possible CLL  Current Therapy:   Observation   Interim History:  Cindy Keller is here today for follow-up. 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 We will now get CT scans of the chest, abdomen and pelvis to further assess. She is still asymptomatic and has no complaints at this time.  Her lymphocyte % is 68.5. No anemia and platelet count is 250.  She is claustrophobic and requested something to help her relax during the scans. We will have her take Valium 30 minutes prior to.  She has had no issue with frequent infection. No lymphadenopathy found on exam.  No fever, chills, n/v, cough, rash, dizziness, SOB, chest pain, palpitations, abdominal pain or changes in bowel or bladder habits.  Nos welling, tenderness, numbness or tingling in her extremities. No c/o pain.  She has a good appetite and is staying well hydrated. Her weight is stable.   ECOG Performance Status: 0 - Asymptomatic  Medications:  Allergies as of 10/15/2016      Reactions   Benadryl [diphenhydramine Hcl]    restless   Codeine    restless   Ciprofloxacin Rash      Medication List       Accurate as of 10/15/16 10:12 AM. Always use your most recent med list.          aspirin EC 81 MG tablet Take 81 mg by mouth daily.   Calcium Carbonate-Vit D-Min 1200-1000 MG-UNIT Chew Chew 1 capsule by mouth daily.   Fish Oil 1000 MG Caps Take 1 capsule by mouth daily.   folic acid 1 MG tablet Commonly known as:  FOLVITE Take 1 tablet (1 mg total) by mouth daily.   losartan 100 MG tablet Commonly known as:  COZAAR Take 100 mg by mouth daily.   vitamin C 500 MG tablet Commonly known as:  ASCORBIC ACID Take 500 mg by mouth daily.   Vitamin D  1000 units capsule Take 1,000 Units by mouth daily.       Allergies:  Allergies  Allergen Reactions  . Benadryl [Diphenhydramine Hcl]     restless  . Codeine     restless  . Ciprofloxacin Rash    Past Medical History, Surgical history, Social history, and Family History were reviewed and updated.  Review of Systems: All other 10 point review of systems is negative.   Physical Exam:  weight is 168 lb 6.4 oz (76.4 kg). Her oral temperature is 98.1 F (36.7 C). Her blood pressure is 137/88 and her pulse is 85. Her respiration is 18 and oxygen saturation is 100%.   Wt Readings from Last 3 Encounters:  10/15/16 168 lb 6.4 oz (76.4 kg)  09/17/16 169 lb (76.7 kg)  12/06/14 175 lb (79.4 kg)    Ocular: Sclerae unicteric, pupils equal, round and reactive to light Ear-nose-throat: Oropharynx clear, dentition fair Lymphatic: No cervical, supraclavicular or axillary adenopathy Lungs no rales or rhonchi, good excursion bilaterally Heart regular rate and rhythm, no murmur appreciated Abd soft, nontender, positive bowel sounds, no liver or spleen tip palpated on exam, no fluid wave. MSK no focal spinal tenderness, no joint edema Neuro: non-focal, well-oriented, appropriate affect Breasts: Deferred   Lab Results  Component Value Date   WBC 5.1 10/15/2016  HGB 12.9 10/15/2016   HCT 35.4 10/15/2016   MCV 78 (L) 10/15/2016   PLT 250 10/15/2016   Lab Results  Component Value Date   FERRITIN 18 11/26/2006   Lab Results  Component Value Date   RETICCTPCT 1.8 08/27/2006   RBC 4.54 10/15/2016   RETICCTABS 98.5 08/27/2006   No results found for: KPAFRELGTCHN, LAMBDASER, KAPLAMBRATIO No results found for: IGGSERUM, IGA, IGMSERUM No results found for: Ronnald Ramp, A1GS, A2GS, Tillman Sers, SPEI   Chemistry      Component Value Date/Time   NA 137 09/17/2016 1020   K 4.5 09/17/2016 1020   CL 106 12/06/2014 1225   CO2 26 09/17/2016 1020   BUN 8.6  09/17/2016 1020   CREATININE 0.9 09/17/2016 1020      Component Value Date/Time   CALCIUM 9.8 09/17/2016 1020   ALKPHOS 71 09/17/2016 1020   AST 22 09/17/2016 1020   ALT 12 09/17/2016 1020   BILITOT 0.87 09/17/2016 1020      Impression and Plan: Cindy Keller is a very pleasant 76 yo caucasian female with history of cold agglutinin disease. 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 We will get scans to further assess for CLL vs. lymphoma.  She continues to be asymptomatic and has no complaints at this time.  We will get her scans scheduled for within the next week and once we have the results we will set up her follow-up appointment.  She will contact our office with any questions or concerns. We can certainly see her sooner if need be.   Eliezer Bottom, NP 9/12/201810:12 AM

## 2016-10-22 ENCOUNTER — Encounter (HOSPITAL_BASED_OUTPATIENT_CLINIC_OR_DEPARTMENT_OTHER): Payer: Self-pay

## 2016-10-22 ENCOUNTER — Ambulatory Visit (HOSPITAL_BASED_OUTPATIENT_CLINIC_OR_DEPARTMENT_OTHER)
Admission: RE | Admit: 2016-10-22 | Discharge: 2016-10-22 | Disposition: A | Discharge status: Home / Self Care | Payer: Medicare Other | Source: Ambulatory Visit | Attending: Family | Admitting: Family

## 2016-10-22 DIAGNOSIS — I2510 Atherosclerotic heart disease of native coronary artery without angina pectoris: Secondary | ICD-10-CM | POA: Insufficient documentation

## 2016-10-22 DIAGNOSIS — C9190 Lymphoid leukemia, unspecified not having achieved remission: Secondary | ICD-10-CM | POA: Insufficient documentation

## 2016-10-22 DIAGNOSIS — C9110 Chronic lymphocytic leukemia of B-cell type not having achieved remission: Secondary | ICD-10-CM

## 2016-10-22 DIAGNOSIS — K7689 Other specified diseases of liver: Secondary | ICD-10-CM | POA: Diagnosis not present

## 2016-10-22 DIAGNOSIS — I700 Atherosclerosis of aorta: Secondary | ICD-10-CM | POA: Insufficient documentation

## 2016-10-22 MED ORDER — IOPAMIDOL (ISOVUE-300) INJECTION 61%
100.0000 mL | Freq: Once | INTRAVENOUS | Status: AC | PRN
  Administered 2016-10-22: 100 mL via INTRAVENOUS

## 2016-10-23 ENCOUNTER — Telehealth: Payer: Self-pay | Admitting: *Deleted

## 2016-10-23 NOTE — Telephone Encounter (Addendum)
Patient aware of results  ----- Message from Eliezer Bottom, NP sent at 10/22/2016  4:28 PM EDT ----- Regarding: scans and follow-up Scans showed no lymphadenopathy! YAY! We will plan to see her back for repeat lab work and follow-up in 4 months. Scheduling message was sent to [facility]. Thank you!  Sarah  ----- Message ----- From: Buel Ream, Rad Results In Sent: 10/22/2016  11:55 AM To: Eliezer Bottom, NP

## 2017-02-20 ENCOUNTER — Other Ambulatory Visit: Payer: Self-pay | Admitting: *Deleted

## 2017-02-20 DIAGNOSIS — R0600 Dyspnea, unspecified: Secondary | ICD-10-CM

## 2017-02-23 ENCOUNTER — Inpatient Hospital Stay: Payer: Medicare Other | Admitting: Hematology & Oncology

## 2017-02-23 ENCOUNTER — Inpatient Hospital Stay: Payer: Medicare Other | Attending: Hematology & Oncology

## 2017-02-23 ENCOUNTER — Other Ambulatory Visit: Payer: Self-pay

## 2017-02-23 ENCOUNTER — Encounter: Payer: Self-pay | Admitting: Hematology & Oncology

## 2017-02-23 VITALS — BP 149/63 | HR 86 | Temp 97.7°F | Resp 18 | Wt 168.0 lb

## 2017-02-23 DIAGNOSIS — D582 Other hemoglobinopathies: Secondary | ICD-10-CM

## 2017-02-23 DIAGNOSIS — Z79899 Other long term (current) drug therapy: Secondary | ICD-10-CM | POA: Diagnosis not present

## 2017-02-23 DIAGNOSIS — Z7982 Long term (current) use of aspirin: Secondary | ICD-10-CM

## 2017-02-23 DIAGNOSIS — D72820 Lymphocytosis (symptomatic): Secondary | ICD-10-CM | POA: Insufficient documentation

## 2017-02-23 DIAGNOSIS — R0600 Dyspnea, unspecified: Secondary | ICD-10-CM

## 2017-02-23 DIAGNOSIS — D591 Other autoimmune hemolytic anemias: Secondary | ICD-10-CM

## 2017-02-23 DIAGNOSIS — D5912 Cold autoimmune hemolytic anemia: Secondary | ICD-10-CM

## 2017-02-23 LAB — CMP (CANCER CENTER ONLY)
ALT: 10 U/L (ref 0–55)
AST: 22 U/L (ref 5–34)
Albumin: 4.3 g/dL (ref 3.5–5.0)
Alkaline Phosphatase: 70 U/L (ref 40–150)
Anion gap: 10 (ref 3–11)
BUN: 10 mg/dL (ref 7–26)
CO2: 24 mmol/L (ref 22–29)
Calcium: 9.5 mg/dL (ref 8.4–10.4)
Chloride: 104 mmol/L (ref 98–109)
Creatinine: 0.93 mg/dL (ref 0.60–1.10)
GFR, Est AFR Am: >60 mL/min (ref >60)
GFR, Estimated: 58 mL/min — ABNORMAL LOW (ref >60)
Glucose, Bld: 90 mg/dL (ref 70–140)
Potassium: 4.5 mmol/L (ref 3.3–4.7)
Sodium: 138 mmol/L (ref 136–145)
Total Bilirubin: 0.7 mg/dL (ref 0.2–1.2)
Total Protein: 7.3 g/dL (ref 6.4–8.3)

## 2017-02-23 LAB — CBC WITH DIFFERENTIAL (CANCER CENTER ONLY)
BASOS PCT: 0 %
Basophils Absolute: 0 10*3/uL (ref 0.0–0.1)
EOS PCT: 0 %
Eosinophils Absolute: 0 10*3/uL (ref 0.0–0.5)
HCT: 36.3 % (ref 34.8–46.6)
HEMOGLOBIN: 12.9 g/dL (ref 11.6–15.9)
Lymphocytes Relative: 64 %
Lymphs Abs: 3.7 10*3/uL — ABNORMAL HIGH (ref 0.9–3.3)
MCH: 28.2 pg (ref 26.0–34.0)
MCHC: 35.5 g/dL (ref 32.0–36.0)
MCV: 79.4 fL — AB (ref 81.0–101.0)
MONOS PCT: 16 %
Monocytes Absolute: 0.9 10*3/uL (ref 0.1–0.9)
NEUTROS ABS: 1.1 10*3/uL — AB (ref 1.5–6.5)
NEUTROS PCT: 20 %
Platelet Count: 327 10*3/uL (ref 145–400)
RBC: 4.57 MIL/uL (ref 3.70–5.32)
RDW: 18.7 % — AB (ref 11.1–15.7)
WBC Count: 5.7 10*3/uL (ref 3.9–10.3)

## 2017-02-23 MED ORDER — FOLIC ACID 1 MG PO TABS: 2.0000 mg | ORAL_TABLET | Freq: Every day | ORAL | 3 refills | Status: DC

## 2017-02-23 NOTE — Progress Notes (Signed)
Hematology and Oncology Follow Up Visit  ANIKAH HOGGE [id number] [birth year] 77 y.o. 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 Hauck is here today for follow-up.  Surprisingly enough, we have not found that she has Hemoglobin AC.  We did a hemoglobin electrophoresis on her.  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  In talking with her, she does have [place] in her blood.  I am sure this is probably where she gets this from.  We did do a CT scan on her.  The CT scan did not show any obvious lymphadenopathy.  She does have a lymphoproliferative process by flow cytometry.  However, clinically, we cannot find any condition.  She feels okay.  She really has no complaints.  She has had no nausea or vomiting.  She has had no fever.  She had no cough or shortness of breath.  She has had no nausea or vomiting.  She currently is taking 1 mg daily of folic acid.  I told her to take 2 mg daily of folic acid.    Overall, her performance status is ECOG 1.  Medications:  Allergies as of 02/23/2017      Reactions   Benadryl [diphenhydramine Hcl]    restless   Codeine    restless   Ciprofloxacin Rash      Medication List        Accurate as of 02/23/17 10:48 AM. Always use your most recent med list.          aspirin EC 81 MG tablet Take 81 mg by mouth daily.   Calcium Carbonate-Vit D-Min 1200-1000 MG-UNIT Chew Chew 1 capsule by mouth daily.   Fish Oil 1000 MG Caps Take 1 capsule by mouth daily.   folic acid 1 MG tablet Commonly known as:  FOLVITE Take 1 tablet (1 mg total) by mouth daily.   losartan 100 MG tablet Commonly known as:  COZAAR Take 100 mg by mouth daily.   vitamin C 500 MG tablet Commonly known as:  ASCORBIC ACID Take 500 mg by mouth daily.   Vitamin D 1000 units capsule Take 1,000 Units by mouth  daily.       Allergies:  Allergies  Allergen Reactions  . Benadryl [Diphenhydramine Hcl]     restless  . Codeine     restless  . Ciprofloxacin Rash    Past Medical History, Surgical history, Social history, and Family History were reviewed and updated.  Review of Systems: Review of Systems  All other systems reviewed and are negative.    Physical Exam:  weight is 168 lb (76.2 kg). Her oral temperature is 97.7 F (36.5 C). Her blood pressure is 149/63 (abnormal) and her pulse is 86. Her respiration is 18 and oxygen saturation is 95%.   Wt Readings from Last 3 Encounters:  02/23/17 168 lb (76.2 kg)  10/15/16 168 lb 6.4 oz (76.4 kg)  09/17/16 169 lb (76.7 kg)    Physical Exam  Constitutional: She is oriented to person, place, and time.  HENT:  Head: Normocephalic and atraumatic.  Mouth/Throat: Oropharynx is clear and moist.  Eyes: EOM are normal. Pupils are equal, round, and reactive to light.  Neck: Normal range of motion.  Cardiovascular: Normal rate, regular rhythm and normal heart sounds.  Pulmonary/Chest: Effort normal and breath sounds normal.  Abdominal: Soft.  Bowel sounds are normal.  Musculoskeletal: Normal range of motion. She exhibits no edema, tenderness or deformity.  Lymphadenopathy:    She has no cervical adenopathy.  Neurological: She is alert and oriented to person, place, and time.  Skin: Skin is warm and dry. No rash noted. No erythema.  Psychiatric: She has a normal mood and affect. Her behavior is normal. Judgment and thought content normal.  Vitals reviewed.   Lab Results  Component Value Date   WBC 5.7 02/23/2017   HGB 12.9 10/15/2016   HCT 36.3 02/23/2017   MCV 79.4 (L) 02/23/2017   PLT 327 02/23/2017   Lab Results  Component Value Date   FERRITIN 18 11/26/2006   Lab Results  Component Value Date   RETICCTPCT 1.8 08/27/2006   RBC 4.57 02/23/2017   RETICCTABS 98.5 08/27/2006   No results found for: KPAFRELGTCHN, LAMBDASER,  KAPLAMBRATIO No results found for: IGGSERUM, IGA, IGMSERUM No results found for: Odetta Pink, SPEI   Chemistry      Component Value Date/Time   NA 141 10/15/2016 0954   NA 137 09/17/2016 1020   K 4.6 10/15/2016 0954   K 4.5 09/17/2016 1020   CL 105 10/15/2016 0954   CO2 28 10/15/2016 0954   CO2 26 09/17/2016 1020   BUN 8 10/15/2016 0954   BUN 8.6 09/17/2016 1020   CREATININE 1.0 10/15/2016 0954   CREATININE 0.9 09/17/2016 1020      Component Value Date/Time   CALCIUM 9.4 10/15/2016 0954   CALCIUM 9.8 09/17/2016 1020   ALKPHOS 62 10/15/2016 0954   ALKPHOS 71 09/17/2016 1020   AST 27 10/15/2016 0954   AST 22 09/17/2016 1020   ALT 21 10/15/2016 0954   ALT 12 09/17/2016 1020   BILITOT 1.00 10/15/2016 0954   BILITOT 0.87 09/17/2016 1020      Impression and Plan: Ms. 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  She is not symptomatic with this.  I cannot find any obvious problems with respect to cold agglutinin disease.  We checked her titer back in August and this was negative.  She does have a flow cytometry positivity for a B-cell lymphoproliferative process.  However, she is totally without symptoms.  I do not think we have to worry about this right now.  I do not think she is any routine scanning.  I will see her back in 3 months.  If all looks good in 3 months, then we will plan to get her back in 6 months.  I want to make sure that she stays well-hydrated.  Volanda Napoleon, MD 1/21/201910:48 AM

## 2017-05-25 ENCOUNTER — Inpatient Hospital Stay: Payer: Medicare Other

## 2017-05-25 ENCOUNTER — Inpatient Hospital Stay: Payer: Medicare Other | Attending: Hematology & Oncology | Admitting: Hematology & Oncology

## 2017-05-25 ENCOUNTER — Encounter: Payer: Self-pay | Admitting: Hematology & Oncology

## 2017-05-25 ENCOUNTER — Other Ambulatory Visit: Payer: Self-pay

## 2017-05-25 VITALS — BP 123/56 | HR 81 | Temp 98.6°F | Resp 16 | Wt 163.0 lb

## 2017-05-25 DIAGNOSIS — Z7982 Long term (current) use of aspirin: Secondary | ICD-10-CM | POA: Insufficient documentation

## 2017-05-25 DIAGNOSIS — R7989 Other specified abnormal findings of blood chemistry: Secondary | ICD-10-CM | POA: Diagnosis not present

## 2017-05-25 DIAGNOSIS — D582 Other hemoglobinopathies: Secondary | ICD-10-CM

## 2017-05-25 DIAGNOSIS — Z79899 Other long term (current) drug therapy: Secondary | ICD-10-CM | POA: Insufficient documentation

## 2017-05-25 DIAGNOSIS — D72820 Lymphocytosis (symptomatic): Secondary | ICD-10-CM | POA: Insufficient documentation

## 2017-05-25 DIAGNOSIS — D57211 Sickle-cell/Hb-C disease with acute chest syndrome: Secondary | ICD-10-CM

## 2017-05-25 LAB — CMP (CANCER CENTER ONLY)
ALT: 12 U/L (ref 0–55)
ANION GAP: 9 (ref 3–11)
AST: 23 U/L (ref 5–34)
Albumin: 4.5 g/dL (ref 3.5–5.0)
Alkaline Phosphatase: 60 U/L (ref 40–150)
BILIRUBIN TOTAL: 0.7 mg/dL (ref 0.2–1.2)
BUN: 9 mg/dL (ref 7–26)
CHLORIDE: 105 mmol/L (ref 98–109)
CO2: 22 mmol/L (ref 22–29)
Calcium: 9.9 mg/dL (ref 8.4–10.4)
Creatinine: 0.91 mg/dL (ref 0.60–1.10)
GFR, EST NON AFRICAN AMERICAN: 59 mL/min — AB (ref >60)
Glucose, Bld: 96 mg/dL (ref 70–140)
POTASSIUM: 4.6 mmol/L (ref 3.5–5.1)
Sodium: 136 mmol/L (ref 136–145)
TOTAL PROTEIN: 7.3 g/dL (ref 6.4–8.3)

## 2017-05-25 LAB — CBC WITH DIFFERENTIAL (CANCER CENTER ONLY)
BAND NEUTROPHILS: 0 %
BASOS PCT: 0 %
BLASTS: 0 %
Basophils Absolute: 0 10*3/uL (ref 0.0–0.1)
Eosinophils Absolute: 0 10*3/uL (ref 0.0–0.5)
Eosinophils Relative: 0 %
HEMATOCRIT: 39.9 % (ref 34.8–46.6)
Hemoglobin: 14 g/dL (ref 11.6–15.9)
LYMPHS PCT: 64 %
Lymphs Abs: 3.9 10*3/uL — ABNORMAL HIGH (ref 0.9–3.3)
MCH: 28.3 pg (ref 26.0–34.0)
MCHC: 35.1 g/dL (ref 32.0–36.0)
MCV: 80.8 fL — AB (ref 81.0–101.0)
MONO ABS: 0.7 10*3/uL (ref 0.1–0.9)
MONOS PCT: 12 %
Metamyelocytes Relative: 0 %
Myelocytes: 0 %
NEUTROS ABS: 1.4 10*3/uL — AB (ref 1.5–6.5)
NEUTROS PCT: 24 %
NRBC: 0 /100{WBCs}
OTHER: 0 %
PLATELETS: 487 10*3/uL — AB (ref 145–400)
PROMYELOCYTES RELATIVE: 0 %
RBC: 4.94 MIL/uL (ref 3.70–5.32)
RDW: 19 % — AB (ref 11.1–15.7)
WBC Count: 6 10*3/uL (ref 3.9–10.0)

## 2017-05-25 LAB — IRON AND TIBC
IRON: 130 ug/dL (ref 41–142)
Saturation Ratios: 44 % (ref 21–57)
TIBC: 296 ug/dL (ref 236–444)
UIBC: 167 ug/dL

## 2017-05-25 LAB — LACTATE DEHYDROGENASE: LDH: 394 U/L — AB (ref 125–245)

## 2017-05-25 LAB — RETICULOCYTES
RBC.: 4.59 MIL/uL (ref 3.70–5.45)
Retic Count, Absolute: 110.2 10*3/uL — ABNORMAL HIGH (ref 33.7–90.7)
Retic Ct Pct: 2.4 % — ABNORMAL HIGH (ref 0.7–2.1)

## 2017-05-25 LAB — FERRITIN: FERRITIN: 55 ng/mL (ref 9–269)

## 2017-05-25 NOTE — Progress Notes (Signed)
Hematology and Oncology Follow Up Visit  Cindy Keller [id number] 1940-06-03 77 y.o. 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   She is doing quite well.  We last saw her back in January.  Since then, she is had no complaints.  There is been no problems with nausea or vomiting.  She has had no abdominal pain.  She is had no cough or shortness of breath.  She is gotten through the flu.  She is had no problems with flu season.  She has had no rashes.  There is been no pain in her hands or feet..  She is on baby aspirin.  I told her that I thought baby aspirin would be helpful for her.  Overall, her performance status is ECOG 0.    Medications:  Allergies as of 05/25/2017      Reactions   Benadryl [diphenhydramine Hcl]    restless   Codeine    restless   Ciprofloxacin Rash      Medication List        Accurate as of 05/25/17 10:22 AM. Always use your most recent med list.          aspirin EC 81 MG tablet Take 81 mg by mouth daily.   Calcium Carbonate-Vit D-Min 1200-1000 MG-UNIT Chew Chew 1 capsule by mouth daily.   Fish Oil 1000 MG Caps Take 1 capsule by mouth daily.   folic acid 1 MG tablet Commonly known as:  FOLVITE Take 2 tablets (2 mg total) by mouth daily.   losartan 100 MG tablet Commonly known as:  COZAAR Take 100 mg by mouth daily.   vitamin C 500 MG tablet Commonly known as:  ASCORBIC ACID Take 500 mg by mouth daily.   Vitamin D 1000 units capsule Take 1,000 Units by mouth daily.       Allergies:  Allergies  Allergen Reactions  . Benadryl [Diphenhydramine Hcl]     restless  . Codeine     restless  . Ciprofloxacin Rash    Past Medical History, Surgical history, Social history, and Family History were reviewed and updated.  Review of Systems: Review of  Systems  All other systems reviewed and are negative.    Physical Exam:  weight is 163 lb (73.9 kg). Her oral temperature is 98.6 F (37 C). Her blood pressure is 123/56 (abnormal) and her pulse is 81. Her respiration is 16 and oxygen saturation is 98%.   Wt Readings from Last 3 Encounters:  05/25/17 163 lb (73.9 kg)  02/23/17 168 lb (76.2 kg)  10/15/16 168 lb 6.4 oz (76.4 kg)    Physical Exam  Constitutional: She is oriented to person, place, and time.  HENT:  Head: Normocephalic and atraumatic.  Mouth/Throat: Oropharynx is clear and moist.  Eyes: Pupils are equal, round, and reactive to light. EOM are normal.  Neck: Normal range of motion.  Cardiovascular: Normal rate, regular rhythm and normal heart sounds.  Pulmonary/Chest: Effort normal and breath sounds normal.  Abdominal: Soft. Bowel sounds are normal.  Musculoskeletal: Normal range of motion. She exhibits no edema, tenderness or deformity.  Lymphadenopathy:    She has no cervical adenopathy.  Neurological: She is alert and oriented to person, place, and time.  Skin: Skin is warm and dry. No rash noted. No erythema.  Psychiatric: She has a normal mood and affect. Her behavior is normal. Judgment and thought content normal.  Vitals reviewed.   Lab Results  Component Value Date   WBC 5.7 02/23/2017   HGB 12.9 10/15/2016   HCT 36.3 02/23/2017   MCV 79.4 (L) 02/23/2017   PLT 327 02/23/2017   Lab Results  Component Value Date   FERRITIN 18 11/26/2006   Lab Results  Component Value Date   RETICCTPCT 1.8 08/27/2006   RBC 4.57 02/23/2017   RETICCTABS 98.5 08/27/2006   No results found for: KPAFRELGTCHN, LAMBDASER, KAPLAMBRATIO No results found for: IGGSERUM, IGA, IGMSERUM No results found for: Odetta Pink, SPEI   Chemistry      Component Value Date/Time   NA 138 02/23/2017 1010   NA 141 10/15/2016 0954   NA 137 09/17/2016 1020   K 4.5 02/23/2017 1010    K 4.6 10/15/2016 0954   K 4.5 09/17/2016 1020   CL 104 02/23/2017 1010   CL 105 10/15/2016 0954   CO2 24 02/23/2017 1010   CO2 28 10/15/2016 0954   CO2 26 09/17/2016 1020   BUN 10 02/23/2017 1010   BUN 8 10/15/2016 0954   BUN 8.6 09/17/2016 1020   CREATININE 0.93 02/23/2017 1010   CREATININE 1.0 10/15/2016 0954   CREATININE 0.9 09/17/2016 1020      Component Value Date/Time   CALCIUM 9.5 02/23/2017 1010   CALCIUM 9.4 10/15/2016 0954   CALCIUM 9.8 09/17/2016 1020   ALKPHOS 70 02/23/2017 1010   ALKPHOS 62 10/15/2016 0954   ALKPHOS 71 09/17/2016 1020   AST 22 02/23/2017 1010   AST 22 09/17/2016 1020   ALT 10 02/23/2017 1010   ALT 21 10/15/2016 0954   ALT 12 09/17/2016 1020   BILITOT 0.7 02/23/2017 1010   BILITOT 0.87 09/17/2016 1020      Impression and Plan: Cindy Keller is a very pleasant 77 yo caucasian female 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  She is not symptomatic with this.  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  It looks like she is asymptomatic with this.  We will have him come back in 4 months.  I think this would be reasonable.  I told her that aspirin I thought would help her given that she does have a little bit of thrombocytosis.  Cindy Napoleon, MD 4/22/201910:22 AM

## 2017-05-26 LAB — IGG, IGA, IGM
IGA: 186 mg/dL (ref 64–422)
IGG (IMMUNOGLOBIN G), SERUM: 794 mg/dL (ref 700–1600)
IgM (Immunoglobulin M), Srm: 288 mg/dL — ABNORMAL HIGH (ref 26–217)

## 2017-05-26 LAB — COLD AGGLUTININ TITER

## 2017-05-26 LAB — BETA 2 MICROGLOBULIN, SERUM: BETA 2 MICROGLOBULIN: 1.9 mg/L (ref 0.6–2.4)

## 2017-05-26 LAB — SOLUBLE TRANSFERRIN RECEPTOR: Transferrin Receptor: 25.5 nmol/L (ref 12.2–27.3)

## 2017-09-21 ENCOUNTER — Encounter: Payer: Self-pay | Admitting: Hematology & Oncology

## 2017-09-21 ENCOUNTER — Other Ambulatory Visit: Payer: Self-pay

## 2017-09-21 ENCOUNTER — Inpatient Hospital Stay (HOSPITAL_BASED_OUTPATIENT_CLINIC_OR_DEPARTMENT_OTHER): Payer: Medicare Other | Admitting: Hematology & Oncology

## 2017-09-21 ENCOUNTER — Inpatient Hospital Stay: Payer: Medicare Other | Attending: Hematology & Oncology

## 2017-09-21 VITALS — BP 155/68 | HR 80 | Temp 98.2°F | Resp 18 | Wt 165.5 lb

## 2017-09-21 DIAGNOSIS — D72829 Elevated white blood cell count, unspecified: Secondary | ICD-10-CM | POA: Diagnosis not present

## 2017-09-21 DIAGNOSIS — D582 Other hemoglobinopathies: Secondary | ICD-10-CM | POA: Diagnosis not present

## 2017-09-21 DIAGNOSIS — Z7982 Long term (current) use of aspirin: Secondary | ICD-10-CM | POA: Insufficient documentation

## 2017-09-21 DIAGNOSIS — D57211 Sickle-cell/Hb-C disease with acute chest syndrome: Secondary | ICD-10-CM

## 2017-09-21 DIAGNOSIS — Z79899 Other long term (current) drug therapy: Secondary | ICD-10-CM | POA: Insufficient documentation

## 2017-09-21 DIAGNOSIS — C9110 Chronic lymphocytic leukemia of B-cell type not having achieved remission: Secondary | ICD-10-CM

## 2017-09-21 LAB — CBC WITH DIFFERENTIAL (CANCER CENTER ONLY)
BASOS ABS: 0 10*3/uL (ref 0.0–0.1)
BLASTS: 0 %
Band Neutrophils: 3 %
Basophils Relative: 0 %
EOS ABS: 0 10*3/uL (ref 0.0–0.5)
Eosinophils Relative: 0 %
HEMATOCRIT: 39.2 % (ref 34.8–46.6)
HEMOGLOBIN: 14.1 g/dL (ref 11.6–15.9)
LYMPHS PCT: 29 %
Lymphs Abs: 4.5 10*3/uL — ABNORMAL HIGH (ref 0.9–3.3)
MCH: 29.8 pg (ref 26.0–34.0)
MCHC: 36 g/dL (ref 32.0–36.0)
MCV: 82.9 fL (ref 81.0–101.0)
METAMYELOCYTES PCT: 0 %
MONO ABS: 1.2 10*3/uL — AB (ref 0.1–0.9)
MYELOCYTES: 0 %
Monocytes Relative: 8 %
Neutro Abs: 9.9 10*3/uL — ABNORMAL HIGH (ref 1.5–6.5)
Neutrophils Relative %: 60 %
OTHER: 0 %
PROMYELOCYTES RELATIVE: 0 %
Platelet Count: 712 10*3/uL — ABNORMAL HIGH (ref 145–400)
RBC: 4.73 MIL/uL (ref 3.70–5.32)
RDW: 17.7 % — ABNORMAL HIGH (ref 11.1–15.7)
WBC Count: 15.6 10*3/uL — ABNORMAL HIGH (ref 3.9–10.0)
nRBC: 1 /100 WBC — ABNORMAL HIGH

## 2017-09-21 LAB — IRON AND TIBC
IRON: 120 ug/dL (ref 41–142)
SATURATION RATIOS: 39 % (ref 21–57)
TIBC: 306 ug/dL (ref 236–444)
UIBC: 187 ug/dL

## 2017-09-21 LAB — FERRITIN: FERRITIN: 81 ng/mL (ref 11–307)

## 2017-09-21 NOTE — Progress Notes (Signed)
Hematology and Oncology Follow Up Visit  Cindy Keller [id number] December 26, 1940 77 y.o. 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   She is doing pretty well.  She really has no complaints since we last saw her.  She is taking folic acid.  I think this is helping her hemoglobin.  She has had no swollen lymph nodes.  She has had no fever.  She has had a change in taste.  She says that she does not want to have much meat any longer.  I am not sure what this signifies.  She has had no rashes.  She has had no leg swelling.  There is been no change in bowel or bladder habits.  Overall, her performance status is ECOG 0.    Medications:  Allergies as of 09/21/2017      Reactions   Benadryl [diphenhydramine Hcl]    restless   Codeine    restless   Ciprofloxacin Rash      Medication List        Accurate as of 09/21/17 10:26 AM. Always use your most recent med list.          aspirin EC 81 MG tablet Take 81 mg by mouth daily.   Calcium Carbonate-Vit D-Min 1200-1000 MG-UNIT Chew Chew 1 capsule by mouth daily.   Fish Oil 1000 MG Caps Take 1 capsule by mouth daily.   folic acid 1 MG tablet Commonly known as:  FOLVITE Take 2 tablets (2 mg total) by mouth daily.   losartan 100 MG tablet Commonly known as:  COZAAR Take 100 mg by mouth daily.   vitamin C 500 MG tablet Commonly known as:  ASCORBIC ACID Take 500 mg by mouth daily.   Vitamin D 1000 units capsule Take 1,000 Units by mouth daily.       Allergies:  Allergies  Allergen Reactions  . Benadryl [Diphenhydramine Hcl]     restless  . Codeine     restless  . Ciprofloxacin Rash    Past Medical History, Surgical history, Social history, and Family History were reviewed and updated.  Review of Systems: Review of Systems  All  other systems reviewed and are negative.    Physical Exam:  weight is 165 lb 8 oz (75.1 kg). Her oral temperature is 98.2 F (36.8 C). Her blood pressure is 155/68 (abnormal) and her pulse is 80. Her respiration is 18 and oxygen saturation is 97%.   Wt Readings from Last 3 Encounters:  09/21/17 165 lb 8 oz (75.1 kg)  05/25/17 163 lb (73.9 kg)  02/23/17 168 lb (76.2 kg)    Physical Exam  Constitutional: She is oriented to person, place, and time.  HENT:  Head: Normocephalic and atraumatic.  Mouth/Throat: Oropharynx is clear and moist.  Eyes: Pupils are equal, round, and reactive to light. EOM are normal.  Neck: Normal range of motion.  Cardiovascular: Normal rate, regular rhythm and normal heart sounds.  Pulmonary/Chest: Effort normal and breath sounds normal.  Abdominal: Soft. Bowel sounds are normal.  Musculoskeletal: Normal range of motion. She exhibits no edema, tenderness or deformity.  Lymphadenopathy:    She has no cervical adenopathy.  Neurological: She is alert and oriented to person, place, and time.  Skin: Skin is warm and dry. No rash noted. No erythema.  Psychiatric:  She has a normal mood and affect. Her behavior is normal. Judgment and thought content normal.  Vitals reviewed.   Lab Results  Component Value Date   WBC 15.6 (H) 09/21/2017   HGB 14.1 09/21/2017   HCT 39.2 09/21/2017   MCV 82.9 09/21/2017   PLT PENDING 09/21/2017   Lab Results  Component Value Date   FERRITIN 55 05/25/2017   IRON 130 05/25/2017   TIBC 296 05/25/2017   UIBC 167 05/25/2017   IRONPCTSAT 44 05/25/2017   Lab Results  Component Value Date   RETICCTPCT 2.4 (H) 05/25/2017   RBC 4.73 09/21/2017   RETICCTABS 98.5 08/27/2006   No results found for: [facility] Lab Results  Component Value Date   IGGSERUM 794 05/25/2017   IGA 186 05/25/2017   IGMSERUM 288 (H) 05/25/2017   No results found for: Odetta Pink, SPEI   Chemistry      Component Value Date/Time   NA 136 05/25/2017 0946   NA 141 10/15/2016 0954   NA 137 09/17/2016 1020   K 4.6 05/25/2017 0946   K 4.6 10/15/2016 0954   K 4.5 09/17/2016 1020   CL 105 05/25/2017 0946   CL 105 10/15/2016 0954   CO2 22 05/25/2017 0946   CO2 28 10/15/2016 0954   CO2 26 09/17/2016 1020   BUN 9 05/25/2017 0946   BUN 8 10/15/2016 0954   BUN 8.6 09/17/2016 1020   CREATININE 0.91 05/25/2017 0946   CREATININE 1.0 10/15/2016 0954   CREATININE 0.9 09/17/2016 1020      Component Value Date/Time   CALCIUM 9.9 05/25/2017 0946   CALCIUM 9.4 10/15/2016 0954   CALCIUM 9.8 09/17/2016 1020   ALKPHOS 60 05/25/2017 0946   ALKPHOS 62 10/15/2016 0954   ALKPHOS 71 09/17/2016 1020   AST 23 05/25/2017 0946   AST 22 09/17/2016 1020   ALT 12 05/25/2017 0946   ALT 21 10/15/2016 0954   ALT 12 09/17/2016 1020   BILITOT 0.7 05/25/2017 0946   BILITOT 0.87 09/17/2016 1020      Impression and Plan: Cindy Keller is a very pleasant 77 yo caucasian female 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  She is not symptomatic with this.  I am a little bit worried about her white cell count jumping up so much.  I am not sure as to why this is.  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  I would like to get her back in about 2 months or so.  I would like to make sure that the white cell count does not keep trending up quickly.    Volanda Napoleon, MD 8/19/201910:26 AM

## 2017-12-21 ENCOUNTER — Other Ambulatory Visit: Payer: Self-pay

## 2017-12-21 ENCOUNTER — Inpatient Hospital Stay: Payer: Medicare Other | Attending: Hematology & Oncology | Admitting: Hematology & Oncology

## 2017-12-21 ENCOUNTER — Telehealth: Payer: Self-pay | Admitting: Hematology & Oncology

## 2017-12-21 ENCOUNTER — Inpatient Hospital Stay: Payer: Medicare Other

## 2017-12-21 ENCOUNTER — Encounter: Payer: Self-pay | Admitting: Hematology & Oncology

## 2017-12-21 VITALS — BP 147/67 | HR 85 | Temp 98.7°F | Resp 18 | Wt 164.0 lb

## 2017-12-21 DIAGNOSIS — R7989 Other specified abnormal findings of blood chemistry: Secondary | ICD-10-CM

## 2017-12-21 DIAGNOSIS — Z79899 Other long term (current) drug therapy: Secondary | ICD-10-CM | POA: Diagnosis not present

## 2017-12-21 DIAGNOSIS — Z7982 Long term (current) use of aspirin: Secondary | ICD-10-CM | POA: Insufficient documentation

## 2017-12-21 DIAGNOSIS — D6804 Acquired von Willebrand disease: Secondary | ICD-10-CM

## 2017-12-21 DIAGNOSIS — D582 Other hemoglobinopathies: Secondary | ICD-10-CM | POA: Insufficient documentation

## 2017-12-21 DIAGNOSIS — D680 Von Willebrand's disease: Secondary | ICD-10-CM

## 2017-12-21 DIAGNOSIS — D473 Essential (hemorrhagic) thrombocythemia: Secondary | ICD-10-CM | POA: Insufficient documentation

## 2017-12-21 DIAGNOSIS — D510 Vitamin B12 deficiency anemia due to intrinsic factor deficiency: Secondary | ICD-10-CM

## 2017-12-21 DIAGNOSIS — D72820 Lymphocytosis (symptomatic): Secondary | ICD-10-CM

## 2017-12-21 DIAGNOSIS — D75838 Other thrombocytosis: Secondary | ICD-10-CM

## 2017-12-21 DIAGNOSIS — C9110 Chronic lymphocytic leukemia of B-cell type not having achieved remission: Secondary | ICD-10-CM

## 2017-12-21 HISTORY — DX: Essential (hemorrhagic) thrombocythemia: D47.3

## 2017-12-21 HISTORY — DX: Lymphocytosis (symptomatic): D72.820

## 2017-12-21 LAB — CBC WITH DIFFERENTIAL (CANCER CENTER ONLY)
Abs Immature Granulocytes: 0.2 10*3/uL — ABNORMAL HIGH (ref 0.00–0.07)
BASOS ABS: 0 10*3/uL (ref 0.0–0.1)
BASOS PCT: 0 %
EOS ABS: 0 10*3/uL (ref 0.0–0.5)
Eosinophils Relative: 0 %
HCT: 40 % (ref 36.0–46.0)
HEMOGLOBIN: 13.7 g/dL (ref 12.0–15.0)
IMMATURE GRANULOCYTES: 1 %
LYMPHS ABS: 4.7 10*3/uL — AB (ref 0.7–4.0)
Lymphocytes Relative: 32 %
MCH: 27.8 pg (ref 26.0–34.0)
MCHC: 34.3 g/dL (ref 30.0–36.0)
MCV: 81.3 fL (ref 80.0–100.0)
Monocytes Absolute: 1.1 10*3/uL — ABNORMAL HIGH (ref 0.1–1.0)
Monocytes Relative: 7 %
NEUTROS PCT: 60 %
NRBC: 0.6 % — AB (ref 0.0–0.2)
Neutro Abs: 8.9 10*3/uL — ABNORMAL HIGH (ref 1.7–7.7)
Platelet Count: 787 10*3/uL — ABNORMAL HIGH (ref 150–400)
RBC: 4.92 MIL/uL (ref 3.87–5.11)
RDW: 16.1 % — AB (ref 11.5–15.5)
WBC Count: 14.9 10*3/uL — ABNORMAL HIGH (ref 4.0–10.5)

## 2017-12-21 LAB — CMP (CANCER CENTER ONLY)
ALBUMIN: 4.5 g/dL (ref 3.5–5.0)
ALT: 11 U/L (ref 0–44)
ANION GAP: 10 (ref 5–15)
AST: 24 U/L (ref 15–41)
Alkaline Phosphatase: 76 U/L (ref 38–126)
BUN: 7 mg/dL — ABNORMAL LOW (ref 8–23)
CHLORIDE: 106 mmol/L (ref 98–111)
CO2: 22 mmol/L (ref 22–32)
Calcium: 9.4 mg/dL (ref 8.9–10.3)
Creatinine: 0.98 mg/dL (ref 0.44–1.00)
GFR, Estimated: 54 mL/min — ABNORMAL LOW (ref >60)
Glucose, Bld: 100 mg/dL — ABNORMAL HIGH (ref 70–99)
Potassium: 4.6 mmol/L (ref 3.5–5.1)
SODIUM: 138 mmol/L (ref 135–145)
Total Bilirubin: 0.7 mg/dL (ref 0.3–1.2)
Total Protein: 7.4 g/dL (ref 6.5–8.1)

## 2017-12-21 LAB — SAVE SMEAR(SSMR), FOR PROVIDER SLIDE REVIEW

## 2017-12-21 LAB — LACTATE DEHYDROGENASE: LDH: 764 U/L — AB (ref 98–192)

## 2017-12-21 NOTE — Progress Notes (Signed)
Hematology and Oncology Follow Up Visit  Cindy Keller [id number] 01/02/1941 77 y.o. 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  Unfortunately, I think that we have some type of underlying hematologic issue.  We did a CT scan of her body back in September 2018.  This did not show any splenomegaly or lymphadenopathy.  I looked at her blood smear.  She has an increased number of platelets.  The platelets are large.  She has some atypical appearing lymphocytes.  She has some smudge cells.  I really believe that we are going to have to be invasive now.  Nothing were going to have to get a bone marrow biopsy on her.  I worry about her developing essential thrombocythemia.  I would not think that there is any myelofibrosis.  I talked to her for about 40 minutes regarding all of this.  She wants to try to hold off until after Christmas.  She does take a baby aspirin.  I explained her that if the platelet count continues to elevate, then she will be at a higher risk of having some form of thrombotic event or could have bleeding because of platelet dysfunction.  She understands my concerns.  She will agree to the bone marrow biopsy and a CT scan of the abdomen and pelvis.  I told her that since we had a CT scan done last year, we can compare the 2 and see what changes are seen.  She has had no bleeding.  There is been no rashes.  She is had no bruising.  Her appetite has been good.  She has had no nausea or vomiting.  She will have a nice Thanksgiving and Christmas with her family.  Overall, her performance status is ECOG 0.    Medications:  Allergies as of 12/21/2017      Reactions   Benadryl [diphenhydramine Hcl]    restless   Codeine    restless   Ciprofloxacin Rash      Medication List        Accurate as of 12/21/17 10:57 AM. Always use your most recent med list.          aspirin EC 81 MG tablet Take 81 mg by mouth daily.   Calcium Carbonate-Vit D-Min 1200-1000 MG-UNIT Chew Chew 1 capsule by mouth daily.   Fish Oil 1000 MG Caps Take 1 capsule by mouth daily.   folic acid 1 MG tablet Commonly known as:  FOLVITE Take 2 tablets (2 mg total) by mouth daily.   losartan 100 MG tablet Commonly known as:  COZAAR Take 100 mg by mouth daily.   vitamin C 500 MG tablet Commonly known as:  ASCORBIC ACID Take 500 mg by mouth daily.   Vitamin D 1000 units capsule Take 1,000 Units by mouth daily.       Allergies:  Allergies  Allergen Reactions  . Benadryl [Diphenhydramine Hcl]     restless  . Codeine     restless  . Ciprofloxacin Rash    Past Medical History, Surgical history, Social history, and Family History were reviewed and updated.  Review of Systems: Review of Systems  Constitutional: Negative.   Eyes: Negative.   Respiratory: Negative.   Cardiovascular: Negative.   Gastrointestinal: Negative.   Genitourinary: Negative.   Musculoskeletal: Negative.  Skin: Negative.  Negative for rash.  Neurological: Negative.   Endo/Heme/Allergies: Negative.   Psychiatric/Behavioral: Negative.   All other systems reviewed and are negative.    Physical Exam:  weight is 164 lb (74.4 kg). Her oral temperature is 98.7 F (37.1 C). Her blood pressure is 147/67 (abnormal) and her pulse is 85. Her respiration is 18 and oxygen saturation is 96%.   Wt Readings from Last 3 Encounters:  12/21/17 164 lb (74.4 kg)  09/21/17 165 lb 8 oz (75.1 kg)  05/25/17 163 lb (73.9 kg)    Physical Exam  Constitutional: She is oriented to person, place, and time.  HENT:  Head: Normocephalic and atraumatic.  Mouth/Throat: Oropharynx is clear and moist.  Eyes: Pupils are equal, round, and reactive to light. EOM are normal.  Neck: Normal range of motion.  Cardiovascular: Normal  rate, regular rhythm and normal heart sounds.  Pulmonary/Chest: Effort normal and breath sounds normal.  Abdominal: Soft. Bowel sounds are normal.  Musculoskeletal: Normal range of motion. She exhibits no edema, tenderness or deformity.  Lymphadenopathy:    She has no cervical adenopathy.  Neurological: She is alert and oriented to person, place, and time.  Skin: Skin is warm and dry. No rash noted. No erythema.  Psychiatric: She has a normal mood and affect. Her behavior is normal. Judgment and thought content normal.  Vitals reviewed.   Lab Results  Component Value Date   WBC 14.9 (H) 12/21/2017   HGB 13.7 12/21/2017   HCT 40.0 12/21/2017   MCV 81.3 12/21/2017   PLT 787 (H) 12/21/2017   Lab Results  Component Value Date   FERRITIN 81 09/21/2017   IRON 120 09/21/2017   TIBC 306 09/21/2017   UIBC 187 09/21/2017   IRONPCTSAT 39 09/21/2017   Lab Results  Component Value Date   RETICCTPCT 2.4 (H) 05/25/2017   RBC 4.92 12/21/2017   RETICCTABS 98.5 08/27/2006   No results found for: [facility] Lab Results  Component Value Date   IGGSERUM 794 05/25/2017   IGA 186 05/25/2017   IGMSERUM 288 (H) 05/25/2017   No results found for: Odetta Pink, SPEI   Chemistry      Component Value Date/Time   NA 136 05/25/2017 0946   NA 141 10/15/2016 0954   NA 137 09/17/2016 1020   K 4.6 05/25/2017 0946   K 4.6 10/15/2016 0954   K 4.5 09/17/2016 1020   CL 105 05/25/2017 0946   CL 105 10/15/2016 0954   CO2 22 05/25/2017 0946   CO2 28 10/15/2016 0954   CO2 26 09/17/2016 1020   BUN 9 05/25/2017 0946   BUN 8 10/15/2016 0954   BUN 8.6 09/17/2016 1020   CREATININE 0.91 05/25/2017 0946   CREATININE 1.0 10/15/2016 0954   CREATININE 0.9 09/17/2016 1020      Component Value Date/Time   CALCIUM 9.9 05/25/2017 0946   CALCIUM 9.4 10/15/2016 0954   CALCIUM 9.8 09/17/2016 1020   ALKPHOS 60 05/25/2017 0946    ALKPHOS 62 10/15/2016 0954   ALKPHOS 71 09/17/2016 1020   AST 23 05/25/2017 0946   AST 22 09/17/2016 1020   ALT 12 05/25/2017 0946   ALT 21 10/15/2016 0954   ALT 12 09/17/2016 1020   BILITOT 0.7 05/25/2017 0946   BILITOT 0.87 09/17/2016 1020      Impression and Plan: Cindy Keller is a very pleasant 77 yo caucasian female 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  She  is not symptomatic with this.  I must worry about some underlying hematologic issue.  Again, a bone marrow biopsy I think it is imperative.  We have to see if she has underlying essential thrombocythemia.  I would not think that there is myelofibrosis.  I am going to send off the JAK2 panel but we see her back.  Again the blood smear does not show me anything that looks like acute leukemia.  I do not see anything that looks like chronic myeloid leukemia.  She will stay on the baby aspirin for right now.  This is incredibly interesting.  I want to make sure that we are proactive and try to intervene before she starts to have any issues.  She understands that by waiting until after the holidays, that there might be a high risk of her having a problem.  I told her that if she starts to note any problems with bruises, bleeding, rashes, weight loss, vomiting, cough or shortness of breath that she needs to call us immediately.  She understands all of this.  She is very appreciative of [place] trying to hold off until after the holidays.      Volanda Napoleon, MD 11/18/201910:57 AM

## 2017-12-21 NOTE — Telephone Encounter (Signed)
sched pt lab/ov per 11/18 LOS. CT biopsy in review to be scheduled close to Jan 2020. appt leter mailed

## 2017-12-22 ENCOUNTER — Telehealth: Payer: Self-pay | Admitting: Hematology & Oncology

## 2017-12-22 LAB — PROTEIN ELECTROPHORESIS, SERUM, WITH REFLEX
A/G RATIO SPE: 1.5 (ref 0.7–1.7)
ALPHA-2-GLOBULIN: 0.6 g/dL (ref 0.4–1.0)
Albumin ELP: 4.3 g/dL (ref 2.9–4.4)
Alpha-1-Globulin: 0.2 g/dL (ref 0.0–0.4)
BETA GLOBULIN: 1.2 g/dL (ref 0.7–1.3)
GAMMA GLOBULIN: 0.7 g/dL (ref 0.4–1.8)
GLOBULIN, TOTAL: 2.8 g/dL (ref 2.2–3.9)
Total Protein ELP: 7.1 g/dL (ref 6.0–8.5)

## 2017-12-22 LAB — IGG, IGA, IGM
IGA: 172 mg/dL (ref 64–422)
IGG (IMMUNOGLOBIN G), SERUM: 761 mg/dL (ref 700–1600)
IGM (IMMUNOGLOBULIN M), SRM: 355 mg/dL — AB (ref 26–217)

## 2017-12-22 NOTE — Telephone Encounter (Signed)
Called and spoke with patient's husband who requested that patient will call me back when she gets home today around 12:00 in order to give her instructions on CT BX information

## 2018-02-05 ENCOUNTER — Ambulatory Visit (HOSPITAL_BASED_OUTPATIENT_CLINIC_OR_DEPARTMENT_OTHER): Payer: Medicare Other

## 2018-02-05 ENCOUNTER — Other Ambulatory Visit: Payer: Medicare Other

## 2018-02-07 ENCOUNTER — Other Ambulatory Visit: Payer: Self-pay | Admitting: Radiology

## 2018-02-09 ENCOUNTER — Ambulatory Visit (HOSPITAL_COMMUNITY): Payer: Medicare Other

## 2018-02-09 ENCOUNTER — Ambulatory Visit (HOSPITAL_COMMUNITY): Admission: RE | Admit: 2018-02-09 | Payer: Medicare Other | Source: Ambulatory Visit

## 2018-02-15 ENCOUNTER — Ambulatory Visit (HOSPITAL_BASED_OUTPATIENT_CLINIC_OR_DEPARTMENT_OTHER)
Admission: RE | Admit: 2018-02-15 | Discharge: 2018-02-15 | Disposition: A | Discharge status: Home / Self Care | Payer: Medicare Other | Source: Ambulatory Visit | Attending: Hematology & Oncology | Admitting: Hematology & Oncology

## 2018-02-15 ENCOUNTER — Encounter: Payer: Self-pay | Admitting: Hematology & Oncology

## 2018-02-15 ENCOUNTER — Inpatient Hospital Stay: Payer: Medicare Other | Attending: Hematology & Oncology | Admitting: Hematology & Oncology

## 2018-02-15 ENCOUNTER — Inpatient Hospital Stay: Payer: Medicare Other

## 2018-02-15 ENCOUNTER — Other Ambulatory Visit: Payer: Self-pay

## 2018-02-15 VITALS — BP 141/57 | HR 77 | Temp 97.5°F | Resp 18 | Wt 162.5 lb

## 2018-02-15 DIAGNOSIS — D473 Essential (hemorrhagic) thrombocythemia: Secondary | ICD-10-CM | POA: Insufficient documentation

## 2018-02-15 DIAGNOSIS — D510 Vitamin B12 deficiency anemia due to intrinsic factor deficiency: Secondary | ICD-10-CM

## 2018-02-15 DIAGNOSIS — D582 Other hemoglobinopathies: Secondary | ICD-10-CM | POA: Insufficient documentation

## 2018-02-15 DIAGNOSIS — D72820 Lymphocytosis (symptomatic): Secondary | ICD-10-CM | POA: Insufficient documentation

## 2018-02-15 DIAGNOSIS — Z7982 Long term (current) use of aspirin: Secondary | ICD-10-CM | POA: Diagnosis not present

## 2018-02-15 DIAGNOSIS — R7989 Other specified abnormal findings of blood chemistry: Secondary | ICD-10-CM

## 2018-02-15 DIAGNOSIS — D680 Von Willebrand's disease: Secondary | ICD-10-CM

## 2018-02-15 DIAGNOSIS — D6804 Acquired von Willebrand disease: Secondary | ICD-10-CM

## 2018-02-15 DIAGNOSIS — D75838 Other thrombocytosis: Secondary | ICD-10-CM

## 2018-02-15 DIAGNOSIS — Z79899 Other long term (current) drug therapy: Secondary | ICD-10-CM

## 2018-02-15 LAB — CMP (CANCER CENTER ONLY)
ALT: 9 U/L (ref 0–44)
AST: 21 U/L (ref 15–41)
Albumin: 4.6 g/dL (ref 3.5–5.0)
Alkaline Phosphatase: 63 U/L (ref 38–126)
Anion gap: 8 (ref 5–15)
BUN: 8 mg/dL (ref 8–23)
CALCIUM: 9.6 mg/dL (ref 8.9–10.3)
CO2: 27 mmol/L (ref 22–32)
CREATININE: 0.9 mg/dL (ref 0.44–1.00)
Chloride: 100 mmol/L (ref 98–111)
GFR, Est AFR Am: >60 mL/min (ref >60)
GFR, Estimated: >60 mL/min (ref >60)
Glucose, Bld: 96 mg/dL (ref 70–99)
Potassium: 4.4 mmol/L (ref 3.5–5.1)
Sodium: 135 mmol/L (ref 135–145)
Total Bilirubin: 0.6 mg/dL (ref 0.3–1.2)
Total Protein: 7 g/dL (ref 6.5–8.1)

## 2018-02-15 LAB — FERRITIN: FERRITIN: 97 ng/mL (ref 11–307)

## 2018-02-15 LAB — LACTATE DEHYDROGENASE: LDH: 832 U/L — ABNORMAL HIGH (ref 98–192)

## 2018-02-15 LAB — APTT: aPTT: 30 seconds (ref 24–36)

## 2018-02-15 LAB — IRON AND TIBC
Iron: 160 ug/dL — ABNORMAL HIGH (ref 41–142)
Saturation Ratios: 54 % (ref 21–57)
TIBC: 296 ug/dL (ref 236–444)
UIBC: 136 ug/dL (ref 120–384)

## 2018-02-15 LAB — CBC WITH DIFFERENTIAL (CANCER CENTER ONLY)
Abs Immature Granulocytes: 0.22 10*3/uL — ABNORMAL HIGH (ref 0.00–0.07)
BASOS ABS: 0 10*3/uL (ref 0.0–0.1)
Basophils Relative: 0 %
EOS ABS: 0 10*3/uL (ref 0.0–0.5)
EOS PCT: 0 %
HEMATOCRIT: 40.1 % (ref 36.0–46.0)
HEMOGLOBIN: 13.7 g/dL (ref 12.0–15.0)
Immature Granulocytes: 2 %
LYMPHS ABS: 3.9 10*3/uL (ref 0.7–4.0)
LYMPHS PCT: 32 %
MCH: 28 pg (ref 26.0–34.0)
MCHC: 34.2 g/dL (ref 30.0–36.0)
MCV: 81.8 fL (ref 80.0–100.0)
MONOS PCT: 8 %
Monocytes Absolute: 0.9 10*3/uL (ref 0.1–1.0)
NRBC: 1 % — AB (ref 0.0–0.2)
Neutro Abs: 7.3 10*3/uL (ref 1.7–7.7)
Neutrophils Relative %: 58 %
Platelet Count: 775 10*3/uL — ABNORMAL HIGH (ref 150–400)
RBC: 4.9 MIL/uL (ref 3.87–5.11)
RDW: 16 % — AB (ref 11.5–15.5)
WBC Count: 12.5 10*3/uL — ABNORMAL HIGH (ref 4.0–10.5)

## 2018-02-15 LAB — VITAMIN B12: Vitamin B-12: 1104 pg/mL — ABNORMAL HIGH (ref 180–914)

## 2018-02-15 MED ORDER — IOPAMIDOL (ISOVUE-300) INJECTION 61%
100.0000 mL | Freq: Once | INTRAVENOUS | Status: AC | PRN
  Administered 2018-02-15: 100 mL via INTRAVENOUS

## 2018-02-15 NOTE — Progress Notes (Signed)
Hematology and Oncology Follow Up Visit  Cindy Keller [id number] 01-06-1941 78 y.o. 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  She had a very nice Thanksgiving and Christmas holiday.  She was with her family.  She really enjoyed this.  She did have a CT scan of the abdomen and pelvis today.  Thankfully, the CT scan did not show any hepatomegaly or splenomegaly.  There were no swollen lymph nodes.  I still have to believe that she has an underlying myeloproliferative disorder.  Her platelet count is 775,000 today.  She is had no bleeding.  There is been no bruising.  She has had no nausea or vomiting.  There is been no change in bowel or bladder habits.  Overall, her performance status is ECOG 0. Medications:  Allergies as of 02/15/2018      Reactions   Benadryl [diphenhydramine Hcl]    restless   Codeine    restless   Ciprofloxacin Rash      Medication List       Accurate as of February 15, 2018 10:17 AM. Always use your most recent med list.        aspirin EC 81 MG tablet Take 81 mg by mouth daily.   Calcium Carbonate-Vit D-Min 1200-1000 MG-UNIT Chew Chew 1 capsule by mouth daily.   Fish Oil 1000 MG Caps Take 1 capsule by mouth daily.   folic acid 1 MG tablet Commonly known as:  FOLVITE Take 2 tablets (2 mg total) by mouth daily.   losartan 100 MG tablet Commonly known as:  COZAAR Take 100 mg by mouth daily.   vitamin C 500 MG tablet Commonly known as:  ASCORBIC ACID Take 500 mg by mouth daily.   Vitamin D 1000 units capsule Take 1,000 Units by mouth daily.       Allergies:  Allergies  Allergen Reactions  . Benadryl [Diphenhydramine Hcl]     restless  . Codeine     restless  . Ciprofloxacin Rash    Past Medical History, Surgical history, Social history, and  Family History were reviewed and updated.  Review of Systems: Review of Systems  Constitutional: Negative.   Eyes: Negative.   Respiratory: Negative.   Cardiovascular: Negative.   Gastrointestinal: Negative.   Genitourinary: Negative.   Musculoskeletal: Negative.   Skin: Negative.  Negative for rash.  Neurological: Negative.   Endo/Heme/Allergies: Negative.   Psychiatric/Behavioral: Negative.   All other systems reviewed and are negative.    Physical Exam:  weight is 162 lb 8 oz (73.7 kg). Her oral temperature is 97.5 F (36.4 C) (abnormal). Her blood pressure is 141/57 (abnormal) and her pulse is 77. Her respiration is 18 and oxygen saturation is 98%.   Wt Readings from Last 3 Encounters:  02/15/18 162 lb 8 oz (73.7 kg)  12/21/17 164 lb (74.4 kg)  09/21/17 165 lb 8 oz (75.1 kg)    Physical Exam Vitals signs reviewed.  HENT:     Head: Normocephalic and atraumatic.  Eyes:     Pupils: Pupils are equal, round, and reactive to light.  Neck:     Musculoskeletal: Normal range of motion.  Cardiovascular:     Rate and Rhythm: Normal rate and regular rhythm.     Heart sounds: Normal heart sounds.  Pulmonary:  Effort: Pulmonary effort is normal.     Breath sounds: Normal breath sounds.  Abdominal:     General: Bowel sounds are normal.     Palpations: Abdomen is soft.  Musculoskeletal: Normal range of motion.        General: No tenderness or deformity.  Lymphadenopathy:     Cervical: No cervical adenopathy.  Skin:    General: Skin is warm and dry.     Findings: No erythema or rash.  Neurological:     Mental Status: She is alert and oriented to person, place, and time.  Psychiatric:        Behavior: Behavior normal.        Thought Content: Thought content normal.        Judgment: Judgment normal.     Lab Results  Component Value Date   WBC 12.5 (H) 02/15/2018   HGB 13.7 02/15/2018   HCT 40.1 02/15/2018   MCV 81.8 02/15/2018   PLT 775 (H) 02/15/2018   Lab  Results  Component Value Date   FERRITIN 81 09/21/2017   IRON 120 09/21/2017   TIBC 306 09/21/2017   UIBC 187 09/21/2017   IRONPCTSAT 39 09/21/2017   Lab Results  Component Value Date   RETICCTPCT 2.4 (H) 05/25/2017   RBC 4.90 02/15/2018   RETICCTABS 98.5 08/27/2006   No results found for: Nils Pyle [facility] Lab Results  Component Value Date   IGGSERUM 761 12/21/2017   IGA 172 12/21/2017   IGMSERUM 355 (H) 12/21/2017   Lab Results  Component Value Date   TOTALPROTELP 7.1 12/21/2017   ALBUMINELP 4.3 12/21/2017   A1GS 0.2 12/21/2017   A2GS 0.6 12/21/2017   BETS 1.2 12/21/2017   GAMS 0.7 12/21/2017   MSPIKE Not Observed 12/21/2017     Chemistry      Component Value Date/Time   NA 135 02/15/2018 0810   NA 141 10/15/2016 0954   NA 137 09/17/2016 1020   K 4.4 02/15/2018 0810   K 4.6 10/15/2016 0954   K 4.5 09/17/2016 1020   CL 100 02/15/2018 0810   CL 105 10/15/2016 0954   CO2 27 02/15/2018 0810   CO2 28 10/15/2016 0954   CO2 26 09/17/2016 1020   BUN 8 02/15/2018 0810   BUN 8 10/15/2016 0954   BUN 8.6 09/17/2016 1020   CREATININE 0.90 02/15/2018 0810   CREATININE 1.0 10/15/2016 0954   CREATININE 0.9 09/17/2016 1020      Component Value Date/Time   CALCIUM 9.6 02/15/2018 0810   CALCIUM 9.4 10/15/2016 0954   CALCIUM 9.8 09/17/2016 1020   ALKPHOS 63 02/15/2018 0810   ALKPHOS 62 10/15/2016 0954   ALKPHOS 71 09/17/2016 1020   AST 21 02/15/2018 0810   AST 22 09/17/2016 1020   ALT 9 02/15/2018 0810   ALT 21 10/15/2016 0954   ALT 12 09/17/2016 1020   BILITOT 0.6 02/15/2018 0810   BILITOT 0.87 09/17/2016 1020      Impression and Plan: Cindy Keller is a very pleasant 78 yo caucasian female 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  She is not symptomatic with this.  We talked about having a bone marrow biopsy done.  She does not want to have one done.  She is asymptomatic right now.  She is taking baby aspirin.  I think this is  reasonable.  She is still going to think about a bone marrow test.  For right now, we will plan to get her back in about 3 months.  I think this is reasonable.  Again, she is totally asymptomatic.  The fact that her CT scan looks okay is also encouraging.     Volanda Napoleon, MD 1/13/202010:17 AM

## 2018-02-16 ENCOUNTER — Other Ambulatory Visit: Payer: Self-pay | Admitting: Family

## 2018-02-16 LAB — VON WILLEBRAND PANEL
Coagulation Factor VIII: 164 % — ABNORMAL HIGH (ref 56–140)
Ristocetin Co-factor, Plasma: 134 % (ref 50–200)
VON WILLEBRAND ANTIGEN, PLASMA: 203 % — AB (ref 50–200)

## 2018-02-16 LAB — KAPPA/LAMBDA LIGHT CHAINS
Kappa free light chain: 49.6 mg/L — ABNORMAL HIGH (ref 3.3–19.4)
Kappa, lambda light chain ratio: 3.59 — ABNORMAL HIGH (ref 0.26–1.65)
Lambda free light chains: 13.8 mg/L (ref 5.7–26.3)

## 2018-02-16 LAB — IGG, IGA, IGM
IgA: 162 mg/dL (ref 64–422)
IgG (Immunoglobin G), Serum: 743 mg/dL (ref 700–1600)
IgM (Immunoglobulin M), Srm: 333 mg/dL — ABNORMAL HIGH (ref 26–217)

## 2018-02-16 LAB — COAG STUDIES INTERP REPORT

## 2018-03-16 LAB — JAK2 (INCLUDING V617F AND EXON 12), MPL,& CALR-NEXT GEN SEQ

## 2018-04-27 ENCOUNTER — Other Ambulatory Visit: Payer: Self-pay | Admitting: Hematology & Oncology

## 2018-04-27 DIAGNOSIS — D72820 Lymphocytosis (symptomatic): Secondary | ICD-10-CM

## 2018-04-27 DIAGNOSIS — D591 Other autoimmune hemolytic anemias: Secondary | ICD-10-CM

## 2018-04-27 DIAGNOSIS — D5912 Cold autoimmune hemolytic anemia: Secondary | ICD-10-CM

## 2018-05-10 ENCOUNTER — Encounter: Payer: Self-pay | Admitting: Hematology & Oncology

## 2018-05-17 ENCOUNTER — Ambulatory Visit: Payer: Medicare Other | Admitting: Hematology & Oncology

## 2018-05-17 ENCOUNTER — Other Ambulatory Visit: Payer: Medicare Other

## 2018-06-20 IMAGING — CT CT CHEST W/ CM
2 of 5 series · 13 of 36 positions shown, 16 images · IV contrast (APPLIED)
Comparison: None.

CLINICAL DATA: pt dx with CLL, HTN, hyperlipidemia, dyspareunia,
chole., vesicovaginal fistula closure with TAH

EXAM:
CT CHEST, ABDOMEN, AND PELVIS WITH CONTRAST
TECHNIQUE: Multidetector CT imaging of the chest, abdomen and pelvis was
performed following the standard protocol during bolus
administration of intravenous contrast.
CONTRAST:  100mL HPGDU0-2CC IOPAMIDOL (HPGDU0-2CC) INJECTION 61%

[Series 2: cap with 2 · axial · 0.90mm/px · z∈[-672,-92]mm · 10 of 142 slices shown, 13 images]
[im 13/142  mediastinal]
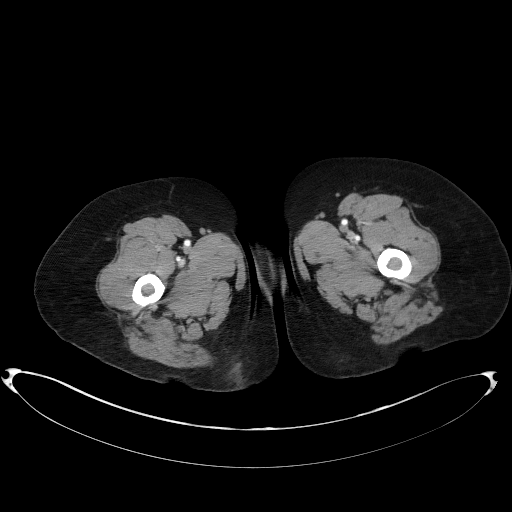
[im 13/142  lung]
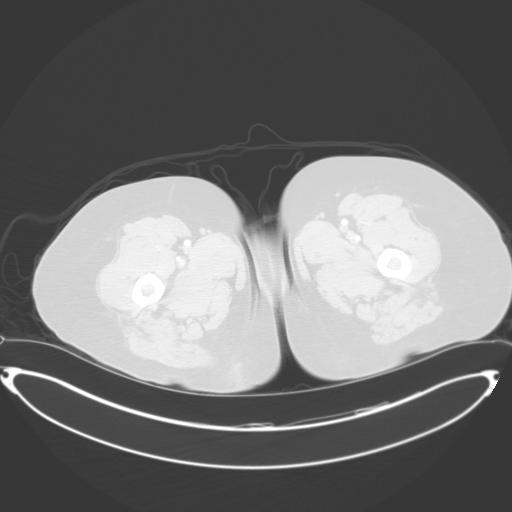
[im 26/142  lung]
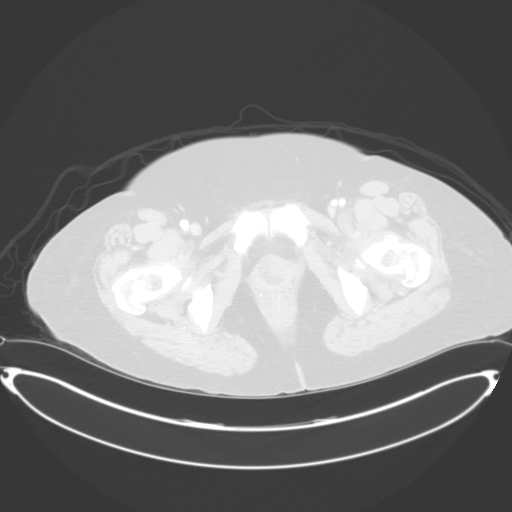
[im 39/142  lung]
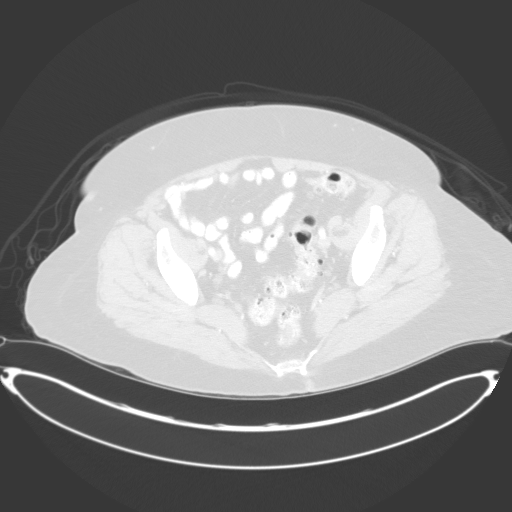
[im 52/142  lung]
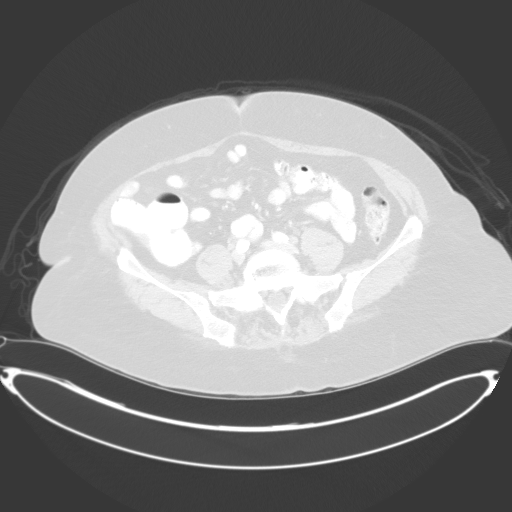
[im 65/142  mediastinal]
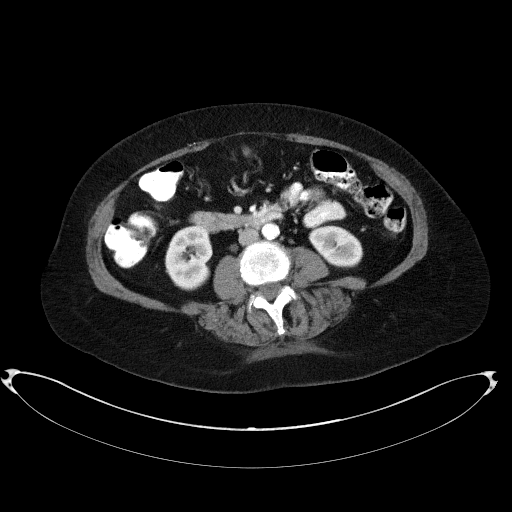
[im 65/142  lung]
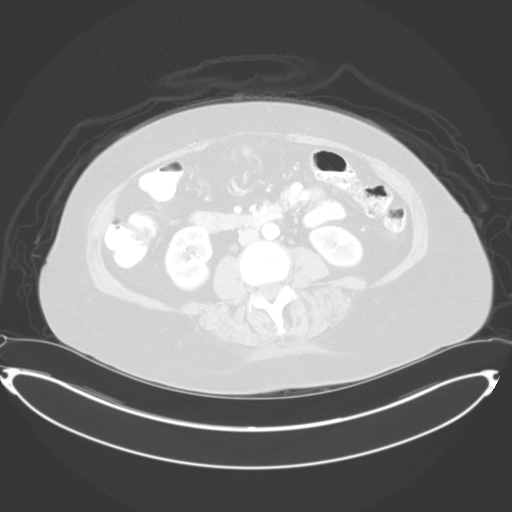
[im 77/142  lung]
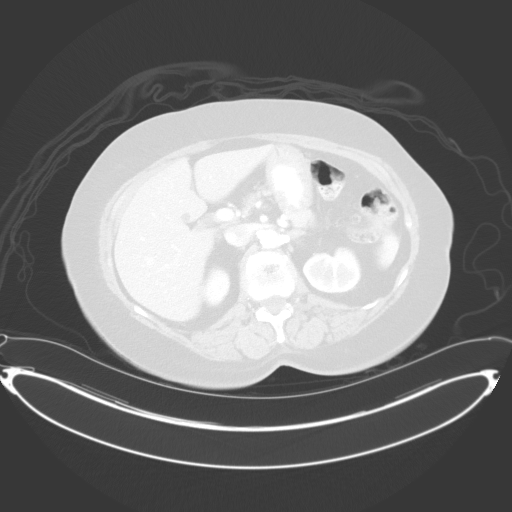
[im 90/142  lung]
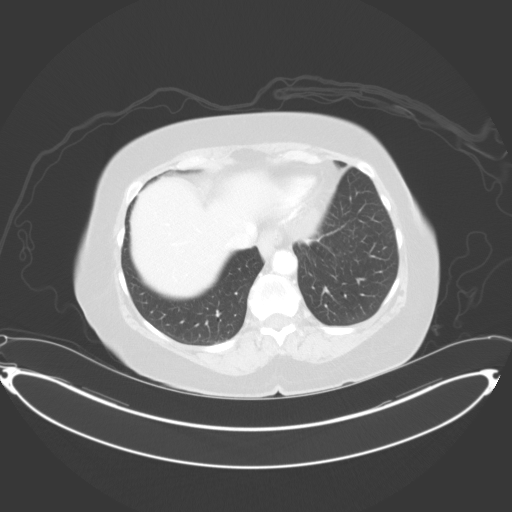
[im 103/142  lung]
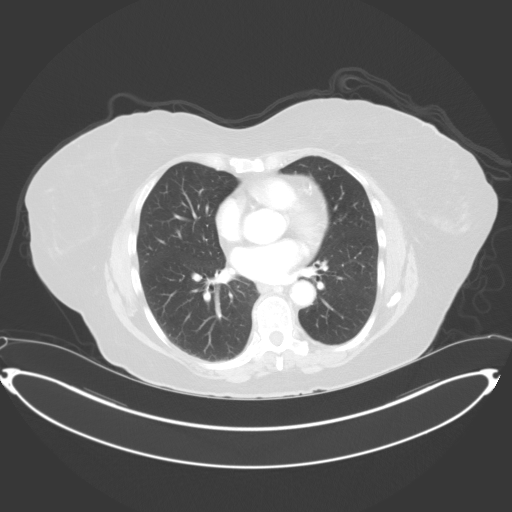
[im 116/142  mediastinal]
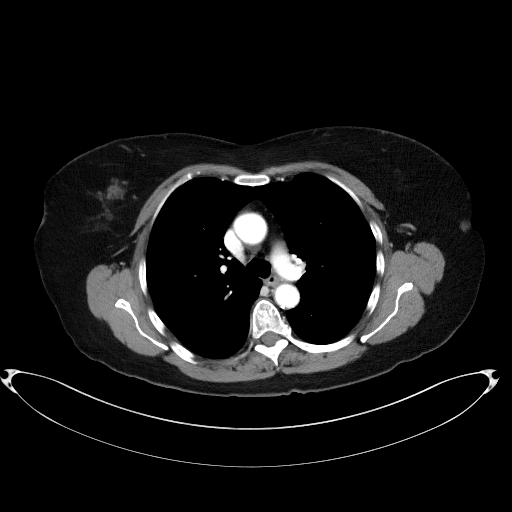
[im 116/142  lung]
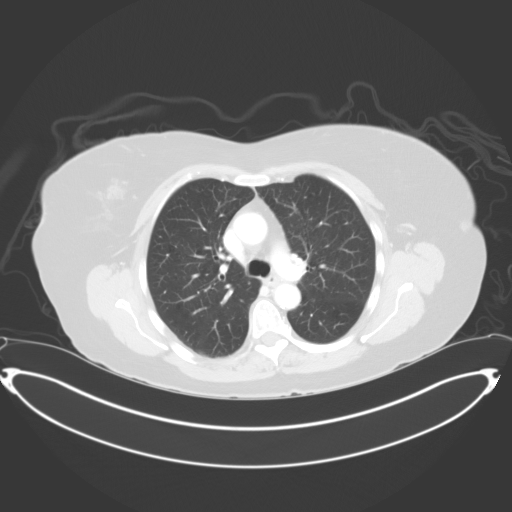
[im 129/142  lung]
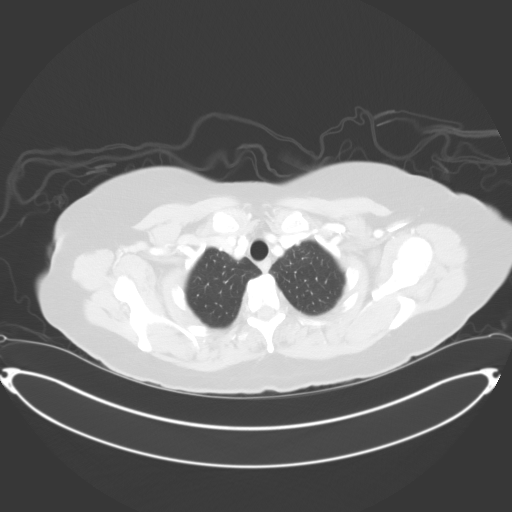

[Series 4: coronals · coronal · 0.91mm/px · 3 of 143 slices shown]
[im 29/143  lung]
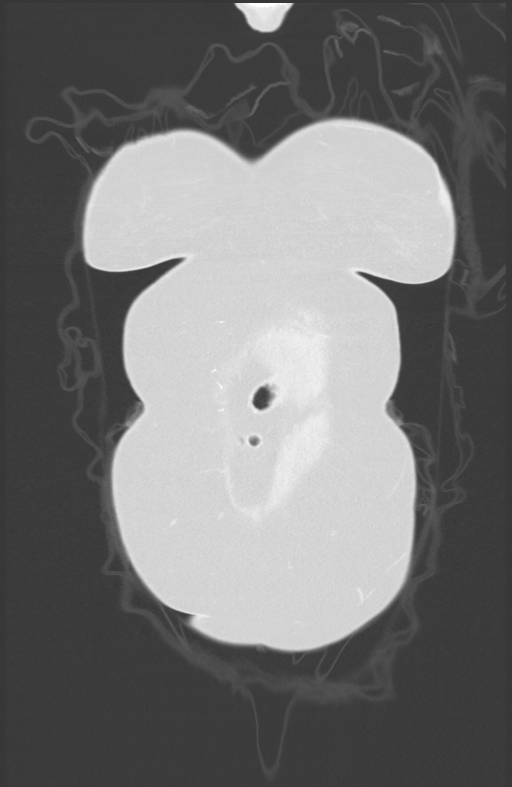
[im 57/143  lung]
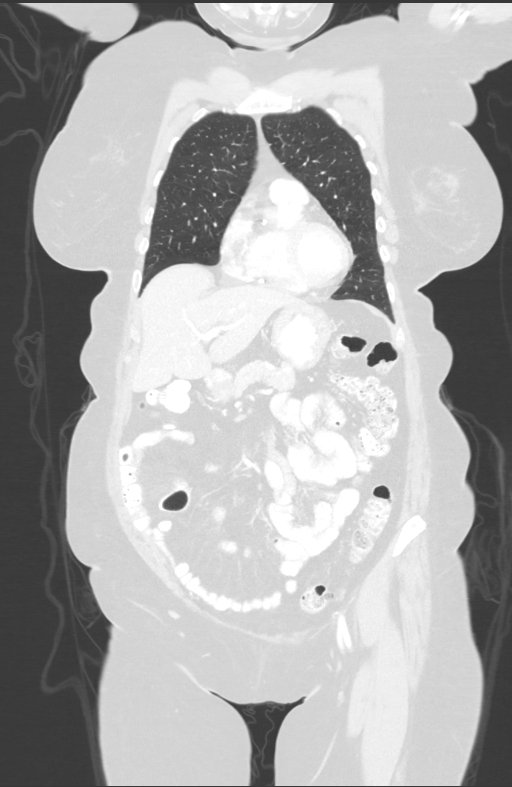
[im 86/143  lung]
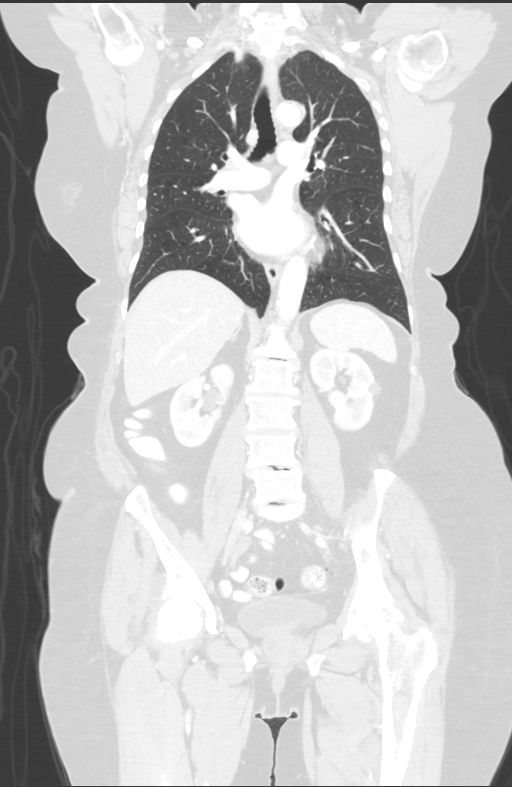

[13 of 36 positions shown; findings below may reference images not displayed]

FINDINGS: CT CHEST FINDINGS

Cardiovascular: Coronary artery calcification and aortic
atherosclerotic calcification.

Mediastinum/Nodes: No axillary supraclavicular adenopathy. No
mediastinal hilar adenopathy.

Lungs/Pleura: No suspicious pulmonary nodules.  Normal airways

Musculoskeletal: No aggressive osseous lesion.

CT ABDOMEN AND PELVIS FINDINGS

Hepatobiliary: Small cysts in central LEFT hepatic lobe no duct
dilatation. Gallbladder surgically absent

Pancreas: Pancreas is normal. No ductal dilatation. No pancreatic
inflammation.

Spleen: Normal volume spleen

Adrenals/urinary tract: Adrenal glands and kidneys are normal. The
ureters and bladder normal.

There is a semi lunate low-density collection surrounding the RIGHT
aspect of the urethra from 12 o'clock toe 6 chronic (image 118,
series 2

Stomach/Bowel: No clear RIGHT multiple diverticular of the
descending and sigmoid colon without evidence of acute inflammation

Vascular/Lymphatic: Abdominal aorta is normal caliber with
atherosclerotic calcification. There is no retroperitoneal or
periportal lymphadenopathy. No pelvic lymphadenopathy.

Reproductive: Post hysterectomy

Other: Semi circular low-density fluid surrounding RIGHT aspect of
the urethra from [DATE] to [DATE] position measuring 9 mm in thickness
(image 118, series 2)

Musculoskeletal: No aggressive osseous lesion.
IMPRESSION: Chest Impression:

1. No evidence of lymphoma in the thorax per
2. Coronary artery calcification and aortic atherosclerotic
calcification.

Abdomen / Pelvis Impression:

1. No evidence of lymphoma.
2. Probable urethral diverticulum. Recommend clinical correlation
and urology consultation.

## 2019-04-20 ENCOUNTER — Other Ambulatory Visit: Payer: Self-pay | Admitting: Hematology & Oncology

## 2019-04-20 DIAGNOSIS — D5912 Cold autoimmune hemolytic anemia: Secondary | ICD-10-CM

## 2019-04-20 DIAGNOSIS — D72820 Lymphocytosis (symptomatic): Secondary | ICD-10-CM

## 2019-06-02 ENCOUNTER — Other Ambulatory Visit: Payer: Self-pay | Admitting: Hematology & Oncology

## 2019-06-02 DIAGNOSIS — D473 Essential (hemorrhagic) thrombocythemia: Secondary | ICD-10-CM

## 2019-06-03 ENCOUNTER — Inpatient Hospital Stay (HOSPITAL_BASED_OUTPATIENT_CLINIC_OR_DEPARTMENT_OTHER): Payer: Medicare Other | Admitting: Hematology & Oncology

## 2019-06-03 ENCOUNTER — Encounter (INDEPENDENT_AMBULATORY_CARE_PROVIDER_SITE_OTHER): Payer: Self-pay

## 2019-06-03 ENCOUNTER — Telehealth: Payer: Self-pay | Admitting: *Deleted

## 2019-06-03 ENCOUNTER — Encounter: Payer: Self-pay | Admitting: Hematology & Oncology

## 2019-06-03 ENCOUNTER — Other Ambulatory Visit: Payer: Self-pay

## 2019-06-03 ENCOUNTER — Ambulatory Visit (HOSPITAL_BASED_OUTPATIENT_CLINIC_OR_DEPARTMENT_OTHER)
Admission: RE | Admit: 2019-06-03 | Discharge: 2019-06-03 | Disposition: A | Discharge status: Home / Self Care | Payer: Medicare Other | Source: Ambulatory Visit | Attending: Hematology & Oncology | Admitting: Hematology & Oncology

## 2019-06-03 ENCOUNTER — Inpatient Hospital Stay: Payer: Medicare Other | Attending: Hematology & Oncology

## 2019-06-03 VITALS — BP 125/62 | HR 92 | Temp 96.2°F | Resp 20 | Wt 140.1 lb

## 2019-06-03 DIAGNOSIS — Z79899 Other long term (current) drug therapy: Secondary | ICD-10-CM | POA: Diagnosis not present

## 2019-06-03 DIAGNOSIS — Z7982 Long term (current) use of aspirin: Secondary | ICD-10-CM | POA: Diagnosis not present

## 2019-06-03 DIAGNOSIS — D473 Essential (hemorrhagic) thrombocythemia: Secondary | ICD-10-CM

## 2019-06-03 DIAGNOSIS — R634 Abnormal weight loss: Secondary | ICD-10-CM | POA: Insufficient documentation

## 2019-06-03 LAB — CBC WITH DIFFERENTIAL (CANCER CENTER ONLY)
Abs Immature Granulocytes: 0.36 10*3/uL — ABNORMAL HIGH (ref 0.00–0.07)
Basophils Absolute: 0 10*3/uL (ref 0.0–0.1)
Basophils Relative: 0 %
Eosinophils Absolute: 0 10*3/uL (ref 0.0–0.5)
Eosinophils Relative: 0 %
HCT: 38.5 % (ref 36.0–46.0)
Hemoglobin: 13.6 g/dL (ref 12.0–15.0)
Immature Granulocytes: 2 %
Lymphocytes Relative: 30 %
Lymphs Abs: 4.8 10*3/uL — ABNORMAL HIGH (ref 0.7–4.0)
MCH: 29.1 pg (ref 26.0–34.0)
MCHC: 35.3 g/dL (ref 30.0–36.0)
MCV: 82.4 fL (ref 80.0–100.0)
Monocytes Absolute: 1.6 10*3/uL — ABNORMAL HIGH (ref 0.1–1.0)
Monocytes Relative: 10 %
Neutro Abs: 9 10*3/uL — ABNORMAL HIGH (ref 1.7–7.7)
Neutrophils Relative %: 58 %
Platelet Count: 656 10*3/uL — ABNORMAL HIGH (ref 150–400)
RBC: 4.67 MIL/uL (ref 3.87–5.11)
RDW: 18 % — ABNORMAL HIGH (ref 11.5–15.5)
WBC Count: 15.9 10*3/uL — ABNORMAL HIGH (ref 4.0–10.5)
nRBC: 0.9 % — ABNORMAL HIGH (ref 0.0–0.2)

## 2019-06-03 LAB — CMP (CANCER CENTER ONLY)
ALT: 12 U/L (ref 0–44)
AST: 25 U/L (ref 15–41)
Albumin: 4.8 g/dL (ref 3.5–5.0)
Alkaline Phosphatase: 63 U/L (ref 38–126)
Anion gap: 8 (ref 5–15)
BUN: 12 mg/dL (ref 8–23)
CO2: 26 mmol/L (ref 22–32)
Calcium: 10.6 mg/dL — ABNORMAL HIGH (ref 8.9–10.3)
Chloride: 98 mmol/L (ref 98–111)
Creatinine: 0.95 mg/dL (ref 0.44–1.00)
GFR, Est AFR Am: >60 mL/min (ref >60)
GFR, Estimated: 57 mL/min — ABNORMAL LOW (ref >60)
Glucose, Bld: 104 mg/dL — ABNORMAL HIGH (ref 70–99)
Potassium: 4.9 mmol/L (ref 3.5–5.1)
Sodium: 132 mmol/L — ABNORMAL LOW (ref 135–145)
Total Bilirubin: 0.7 mg/dL (ref 0.3–1.2)
Total Protein: 7.2 g/dL (ref 6.5–8.1)

## 2019-06-03 LAB — IRON AND TIBC
Iron: 127 ug/dL (ref 41–142)
Saturation Ratios: 42 % (ref 21–57)
TIBC: 304 ug/dL (ref 236–444)
UIBC: 177 ug/dL (ref 120–384)

## 2019-06-03 LAB — FERRITIN: Ferritin: 198 ng/mL (ref 11–307)

## 2019-06-03 LAB — LACTATE DEHYDROGENASE: LDH: 912 U/L — ABNORMAL HIGH (ref 98–192)

## 2019-06-03 LAB — PLATELET BY CITRATE

## 2019-06-03 LAB — SAVE SMEAR(SSMR), FOR PROVIDER SLIDE REVIEW

## 2019-06-03 NOTE — Telephone Encounter (Signed)
Call placed to patient to notify her per order of Dr. Marin Olp to contact her PCP for referral to Dr. Lynann Bologna.  Pt appreciative of call back and states that her PCP is already working on a referral for her for ortho MD.

## 2019-06-03 NOTE — Progress Notes (Signed)
Hematology and Oncology Follow Up Visit  Cindy Keller [id number] 1940-11-11 79 y.o. 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  She has not been here for about 16 months.  She was not able to make her appointments because she was worried about the coronavirus.  Back in October 2020, she was bowling and hurt her back.  She was in severe pain.  She has never had an x-ray for this.  She has seen her family doctor.  She apparently is supposed to see an orthopedic surgeon.  She has never had x-rays as stated previously.  We will go ahead and get some x-rays on her today.  Otherwise, she is seems to be doing okay.  She is lost weight.  She says she was not eating much because of the pain that she was in.  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  Her platelet count is actually better than it was when we last saw her 16 months ago.  She has had no problems with pain in her hands or feet.  She has had no swelling in her legs.  She had no nausea or vomiting.  There is been no abdominal pain.  There is been no change in bowel or bladder habits.  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  As such, we will probably have to check a cold agglutinin titer on her.  Her birthday was earlier this week.  Hopefully she was able to enjoy it.  Overall, her performance status is ECOG 1.   Medications:  Allergies as of 06/03/2019      Reactions   Benadryl [diphenhydramine Hcl]    restless   Codeine    restless   Ciprofloxacin Rash      Medication List       Accurate as of June 03, 2019  9:14 AM. If you have any questions, ask your  nurse or doctor.        aspirin EC 81 MG tablet Take 81 mg by mouth daily.   Calcium Carbonate-Vit D-Min 1200-1000 MG-UNIT Chew Chew 1 capsule by mouth daily.   Fish Oil 1000 MG Caps Take 1 capsule by mouth daily.   folic acid 1 MG tablet Commonly known as: FOLVITE TAKE 2 TABLETS BY MOUTH EVERY DAY   losartan 100 MG tablet Commonly known as: COZAAR Take 100 mg by mouth daily.   vitamin C 500 MG tablet Commonly known as: ASCORBIC ACID Take 500 mg by mouth daily.   Vitamin D 1000 units capsule Take 1,000 Units by mouth daily.       Allergies:  Allergies  Allergen Reactions  . Benadryl [Diphenhydramine Hcl]     restless  . Codeine     restless  . Ciprofloxacin Rash    Past Medical History, Surgical history, Social history, and Family History were reviewed and updated.  Review of Systems: Review of Systems  Constitutional: Negative.   Eyes: Negative.   Respiratory: Negative.   Cardiovascular: Negative.   Gastrointestinal: Negative.   Genitourinary: Negative.   Musculoskeletal: Negative.   Skin: Negative.  Negative for rash.  Neurological: Negative.  Endo/Heme/Allergies: Negative.   Psychiatric/Behavioral: Negative.   All other systems reviewed and are negative.    Physical Exam:  weight is 140 lb 1.3 oz (63.5 kg). Her temporal temperature is 96.2 F (35.7 C) (abnormal). Her blood pressure is 125/62 and her pulse is 92. Her respiration is 20 and oxygen saturation is 98%.   Wt Readings from Last 3 Encounters:  06/03/19 140 lb 1.3 oz (63.5 kg)  02/15/18 162 lb 8 oz (73.7 kg)  12/21/17 164 lb (74.4 kg)    Physical Exam Vitals reviewed.  HENT:     Head: Normocephalic and atraumatic.  Eyes:     Pupils: Pupils are equal, round, and reactive to light.  Cardiovascular:     Rate and Rhythm: Normal rate and regular rhythm.     Heart sounds: Normal heart sounds.  Pulmonary:     Effort: Pulmonary effort is normal.     Breath sounds: Normal breath  sounds.  Abdominal:     General: Bowel sounds are normal.     Palpations: Abdomen is soft.  Musculoskeletal:        General: No tenderness or deformity. Normal range of motion.     Cervical back: Normal range of motion.  Lymphadenopathy:     Cervical: No cervical adenopathy.  Skin:    General: Skin is warm and dry.     Findings: No erythema or rash.  Neurological:     Mental Status: She is alert and oriented to person, place, and time.  Psychiatric:        Behavior: Behavior normal.        Thought Content: Thought content normal.        Judgment: Judgment normal.     Lab Results  Component Value Date   WBC 15.9 (H) 06/03/2019   HGB 13.6 06/03/2019   HCT 38.5 06/03/2019   MCV 82.4 06/03/2019   PLT 656 (H) 06/03/2019   Lab Results  Component Value Date   FERRITIN 97 02/15/2018   IRON 160 (H) 02/15/2018   TIBC 296 02/15/2018   UIBC 136 02/15/2018   IRONPCTSAT 54 02/15/2018   Lab Results  Component Value Date   RETICCTPCT 2.4 (H) 05/25/2017   RBC 4.67 06/03/2019   RETICCTABS 98.5 08/27/2006   Lab Results  Component Value Date   KPAFRELGTCHN 49.6 (H) 02/15/2018   LAMBDASER 13.8 02/15/2018   KAPLAMBRATIO 3.59 (H) 02/15/2018   Lab Results  Component Value Date   IGGSERUM 743 02/15/2018   IGA 162 02/15/2018   IGMSERUM 333 (H) 02/15/2018   Lab Results  Component Value Date   TOTALPROTELP 7.1 12/21/2017   ALBUMINELP 4.3 12/21/2017   A1GS 0.2 12/21/2017   A2GS 0.6 12/21/2017   BETS 1.2 12/21/2017   GAMS 0.7 12/21/2017   MSPIKE Not Observed 12/21/2017     Chemistry      Component Value Date/Time   NA 135 02/15/2018 0810   NA 141 10/15/2016 0954   NA 137 09/17/2016 1020   K 4.4 02/15/2018 0810   K 4.6 10/15/2016 0954   K 4.5 09/17/2016 1020   CL 100 02/15/2018 0810   CL 105 10/15/2016 0954   CO2 27 02/15/2018 0810   CO2 28 10/15/2016 0954   CO2 26 09/17/2016 1020   BUN 8 02/15/2018 0810   BUN 8 10/15/2016 0954   BUN 8.6 09/17/2016 1020   CREATININE  0.90 02/15/2018 0810   CREATININE 1.0 10/15/2016 0954   CREATININE 0.9 09/17/2016 1020  Component Value Date/Time   CALCIUM 9.6 02/15/2018 0810   CALCIUM 9.4 10/15/2016 0954   CALCIUM 9.8 09/17/2016 1020   ALKPHOS 63 02/15/2018 0810   ALKPHOS 62 10/15/2016 0954   ALKPHOS 71 09/17/2016 1020   AST 21 02/15/2018 0810   AST 22 09/17/2016 1020   ALT 9 02/15/2018 0810   ALT 21 10/15/2016 0954   ALT 12 09/17/2016 1020   BILITOT 0.6 02/15/2018 0810   BILITOT 0.87 09/17/2016 1020      Impression and Plan: Ms. 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  She is not symptomatic with this.  I will order the x-ray of her back.  We will have to see what this shows.  I looked at her blood smear.  I really do not see anything that looked all that suspicious.  Again, we will have to see checking her for cold agglutinin disease.  I will try to get her back to see [place] in another few months.  She really does not like having to go see the doctor.  I am glad that we were able to see her.     Volanda Napoleon, MD 4/30/20219:14 AM

## 2019-06-03 NOTE — Telephone Encounter (Signed)
Pt notified per order of Dr. Marin Olp that "there is an old compression fx at T-12.  This could have happened with bowling.  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 Marin Olp mentioned her seeing a back doctor.  Informed pt that I would speak with Dr. Marin Olp and call her back with MD orders.

## 2019-06-03 NOTE — Telephone Encounter (Signed)
-----   Message from Volanda Napoleon, MD sent at 06/03/2019  1:59 PM EDT ----- Call - there is an old compression fx at T-12.  This could have happened with bowling.  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  Laurey Arrow

## 2019-09-02 ENCOUNTER — Inpatient Hospital Stay: Payer: Medicare Other

## 2019-09-02 ENCOUNTER — Encounter: Payer: Self-pay | Admitting: Hematology & Oncology

## 2019-09-02 ENCOUNTER — Other Ambulatory Visit: Payer: Self-pay

## 2019-09-02 ENCOUNTER — Inpatient Hospital Stay: Payer: Medicare Other | Attending: Hematology & Oncology | Admitting: Hematology & Oncology

## 2019-09-02 VITALS — BP 124/61 | HR 89 | Temp 98.5°F | Resp 16 | Wt 138.0 lb

## 2019-09-02 DIAGNOSIS — Z7982 Long term (current) use of aspirin: Secondary | ICD-10-CM | POA: Diagnosis not present

## 2019-09-02 DIAGNOSIS — Z79899 Other long term (current) drug therapy: Secondary | ICD-10-CM | POA: Insufficient documentation

## 2019-09-02 DIAGNOSIS — D473 Essential (hemorrhagic) thrombocythemia: Secondary | ICD-10-CM | POA: Diagnosis not present

## 2019-09-02 DIAGNOSIS — D72820 Lymphocytosis (symptomatic): Secondary | ICD-10-CM

## 2019-09-02 LAB — CBC WITH DIFFERENTIAL (CANCER CENTER ONLY)
Abs Immature Granulocytes: 0.7 10*3/uL — ABNORMAL HIGH (ref 0.00–0.07)
Basophils Absolute: 0.1 10*3/uL (ref 0.0–0.1)
Basophils Relative: 0 %
Eosinophils Absolute: 0 10*3/uL (ref 0.0–0.5)
Eosinophils Relative: 0 %
HCT: 37.6 % (ref 36.0–46.0)
Hemoglobin: 12.9 g/dL (ref 12.0–15.0)
Immature Granulocytes: 4 %
Lymphocytes Relative: 29 %
Lymphs Abs: 4.7 10*3/uL — ABNORMAL HIGH (ref 0.7–4.0)
MCH: 27.9 pg (ref 26.0–34.0)
MCHC: 34.3 g/dL (ref 30.0–36.0)
MCV: 81.2 fL (ref 80.0–100.0)
Monocytes Absolute: 1.9 10*3/uL — ABNORMAL HIGH (ref 0.1–1.0)
Monocytes Relative: 12 %
Neutro Abs: 8.7 10*3/uL — ABNORMAL HIGH (ref 1.7–7.7)
Neutrophils Relative %: 55 %
Platelet Count: 571 10*3/uL — ABNORMAL HIGH (ref 150–400)
RBC: 4.63 MIL/uL (ref 3.87–5.11)
RDW: 18.2 % — ABNORMAL HIGH (ref 11.5–15.5)
WBC Count: 16 10*3/uL — ABNORMAL HIGH (ref 4.0–10.5)
nRBC: 1.6 % — ABNORMAL HIGH (ref 0.0–0.2)

## 2019-09-02 LAB — CMP (CANCER CENTER ONLY)
ALT: 11 U/L (ref 0–44)
AST: 23 U/L (ref 15–41)
Albumin: 4.9 g/dL (ref 3.5–5.0)
Alkaline Phosphatase: 69 U/L (ref 38–126)
Anion gap: 7 (ref 5–15)
BUN: 14 mg/dL (ref 8–23)
CO2: 26 mmol/L (ref 22–32)
Calcium: 10 mg/dL (ref 8.9–10.3)
Chloride: 96 mmol/L — ABNORMAL LOW (ref 98–111)
Creatinine: 1.04 mg/dL — ABNORMAL HIGH (ref 0.44–1.00)
GFR, Est AFR Am: 59 mL/min — ABNORMAL LOW (ref >60)
GFR, Estimated: 51 mL/min — ABNORMAL LOW (ref >60)
Glucose, Bld: 98 mg/dL (ref 70–99)
Potassium: 4.9 mmol/L (ref 3.5–5.1)
Sodium: 129 mmol/L — ABNORMAL LOW (ref 135–145)
Total Bilirubin: 0.7 mg/dL (ref 0.3–1.2)
Total Protein: 7 g/dL (ref 6.5–8.1)

## 2019-09-02 LAB — SAVE SMEAR(SSMR), FOR PROVIDER SLIDE REVIEW

## 2019-09-02 LAB — LACTATE DEHYDROGENASE: LDH: 1049 U/L — ABNORMAL HIGH (ref 98–192)

## 2019-09-02 NOTE — Progress Notes (Signed)
Hematology and Oncology Follow Up Visit  AYSSA BENTIVEGNA [id number] May 05, 1940 79 y.o. 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 Waggoner is here today for follow-up.  Her back is doing a little bit better.  When we saw her last time, we did do x-rays.  She had a old compression fracture at T12.  She did go ahead and have an epidural steroid injection.  This seemed to help.  She probably will call the orthopedist to see about another injection.  Otherwise she really has had no problems.  She has had the coronavirus vaccine in.  She has had no issues with fever.  She has had no cough or shortness of breath.  There has been no change in bowel or bladder habits.  Overall, I feel that her performance status is by ECOG 1.     Medications:  Allergies as of 09/02/2019      Reactions   Benadryl [diphenhydramine Hcl]    restless   Codeine    restless   Ciprofloxacin Rash      Medication List       Accurate as of September 02, 2019 10:36 AM. If you have any questions, ask your nurse or doctor.        aspirin EC 81 MG tablet Take 81 mg by mouth daily.   Calcium Carbonate-Vit D-Min 1200-1000 MG-UNIT Chew Chew 1 capsule by mouth daily.   Fish Oil 1000 MG Caps Take 1 capsule by mouth daily.   folic acid 1 MG tablet Commonly known as: FOLVITE TAKE 2 TABLETS BY MOUTH EVERY DAY   losartan 100 MG tablet Commonly known as: COZAAR Take 100 mg by mouth daily.   vitamin C 500 MG tablet Commonly known as: ASCORBIC ACID Take 500 mg by mouth daily.   Vitamin D 1000 units capsule Take 1,000 Units by mouth daily.       Allergies:  Allergies  Allergen Reactions  . Benadryl [Diphenhydramine Hcl]     restless  . Codeine     restless  . Ciprofloxacin Rash    Past Medical History, Surgical history, Social history, and Family History were  reviewed and updated.  Review of Systems: Review of Systems  Constitutional: Negative.   Eyes: Negative.   Respiratory: Negative.   Cardiovascular: Negative.   Gastrointestinal: Negative.   Genitourinary: Negative.   Musculoskeletal: Negative.   Skin: Negative.  Negative for rash.  Neurological: Negative.   Endo/Heme/Allergies: Negative.   Psychiatric/Behavioral: Negative.   All other systems reviewed and are negative.    Physical Exam:  vitals were not taken for this visit.   Wt Readings from Last 3 Encounters:  06/03/19 140 lb 1.3 oz (63.5 kg)  02/15/18 162 lb 8 oz (73.7 kg)  12/21/17 164 lb (74.4 kg)    Physical Exam Vitals reviewed.  HENT:     Head: Normocephalic and atraumatic.  Eyes:     Pupils: Pupils are equal, round, and reactive to light.  Cardiovascular:     Rate and Rhythm: Normal rate and regular rhythm.     Heart sounds: Normal heart sounds.  Pulmonary:     Effort: Pulmonary effort is normal.     Breath sounds: Normal breath sounds.  Abdominal:     General: Bowel sounds are normal.     Palpations: Abdomen is soft.  Musculoskeletal:  General: No tenderness or deformity. Normal range of motion.     Cervical back: Normal range of motion.  Lymphadenopathy:     Cervical: No cervical adenopathy.  Skin:    General: Skin is warm and dry.     Findings: No erythema or rash.  Neurological:     Mental Status: She is alert and oriented to person, place, and time.  Psychiatric:        Behavior: Behavior normal.        Thought Content: Thought content normal.        Judgment: Judgment normal.     Lab Results  Component Value Date   WBC 16.0 (H) 09/02/2019   HGB 12.9 09/02/2019   HCT 37.6 09/02/2019   MCV 81.2 09/02/2019   PLT 571 (H) 09/02/2019   Lab Results  Component Value Date   FERRITIN 198 06/03/2019   IRON 127 06/03/2019   TIBC 304 06/03/2019   UIBC 177 06/03/2019   IRONPCTSAT 42 06/03/2019   Lab Results  Component Value Date    RETICCTPCT 2.4 (H) 05/25/2017   RBC 4.63 09/02/2019   RETICCTABS 98.5 08/27/2006   Lab Results  Component Value Date   KPAFRELGTCHN 49.6 (H) 02/15/2018   LAMBDASER 13.8 02/15/2018   KAPLAMBRATIO 3.59 (H) 02/15/2018   Lab Results  Component Value Date   IGGSERUM 743 02/15/2018   IGA 162 02/15/2018   IGMSERUM 333 (H) 02/15/2018   Lab Results  Component Value Date   TOTALPROTELP 7.1 12/21/2017   ALBUMINELP 4.3 12/21/2017   A1GS 0.2 12/21/2017   A2GS 0.6 12/21/2017   BETS 1.2 12/21/2017   GAMS 0.7 12/21/2017   MSPIKE Not Observed 12/21/2017     Chemistry      Component Value Date/Time   NA 129 (L) 09/02/2019 0957   NA 141 10/15/2016 0954   NA 137 09/17/2016 1020   K 4.9 09/02/2019 0957   K 4.6 10/15/2016 0954   K 4.5 09/17/2016 1020   CL 96 (L) 09/02/2019 0957   CL 105 10/15/2016 0954   CO2 26 09/02/2019 0957   CO2 28 10/15/2016 0954   CO2 26 09/17/2016 1020   BUN 14 09/02/2019 0957   BUN 8 10/15/2016 0954   BUN 8.6 09/17/2016 1020   CREATININE 1.04 (H) 09/02/2019 0957   CREATININE 1.0 10/15/2016 0954   CREATININE 0.9 09/17/2016 1020      Component Value Date/Time   CALCIUM 10.0 09/02/2019 0957   CALCIUM 9.4 10/15/2016 0954   CALCIUM 9.8 09/17/2016 1020   ALKPHOS 69 09/02/2019 0957   ALKPHOS 62 10/15/2016 0954   ALKPHOS 71 09/17/2016 1020   AST 23 09/02/2019 0957   AST 22 09/17/2016 1020   ALT 11 09/02/2019 0957   ALT 21 10/15/2016 0954   ALT 12 09/17/2016 1020   BILITOT 0.7 09/02/2019 0957   BILITOT 0.87 09/17/2016 1020      Impression and Plan: Ms. 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  She is not symptomatic with this.  Her platelet count is getting better.  Is slowly coming down.  I would have to believe that this is all reflective of possible inflammatory issues.  I looked at her blood smear under the microscope.  I really do not see anything that looked suspicious for a myeloproliferative  disorder.  I suppose she may have one but since the platelets are getting better and number, we can just watch.  We will plan  to get her back in 4 months now.  Hopefully, there will be no problems between now and November.       Volanda Napoleon, MD 7/30/202110:36 AM

## 2019-09-05 LAB — COLD AGGLUTININ TITER: Cold Agglutinin Titer: NEGATIVE

## 2020-01-11 ENCOUNTER — Inpatient Hospital Stay: Payer: Medicare Other | Attending: Hematology & Oncology | Admitting: Hematology & Oncology

## 2020-01-11 ENCOUNTER — Inpatient Hospital Stay: Payer: Medicare Other

## 2020-04-13 ENCOUNTER — Other Ambulatory Visit: Payer: Self-pay | Admitting: Hematology & Oncology

## 2020-04-13 DIAGNOSIS — D72820 Lymphocytosis (symptomatic): Secondary | ICD-10-CM

## 2020-04-13 DIAGNOSIS — D5912 Cold autoimmune hemolytic anemia: Secondary | ICD-10-CM

## 2020-04-17 DIAGNOSIS — Z1231 Encounter for screening mammogram for malignant neoplasm of breast: Secondary | ICD-10-CM | POA: Diagnosis not present

## 2020-04-27 DIAGNOSIS — H35352 Cystoid macular degeneration, left eye: Secondary | ICD-10-CM | POA: Diagnosis not present

## 2020-05-02 DIAGNOSIS — H34812 Central retinal vein occlusion, left eye, with macular edema: Secondary | ICD-10-CM | POA: Diagnosis not present

## 2020-05-02 DIAGNOSIS — H43813 Vitreous degeneration, bilateral: Secondary | ICD-10-CM | POA: Diagnosis not present

## 2020-05-09 DIAGNOSIS — E7800 Pure hypercholesterolemia, unspecified: Secondary | ICD-10-CM | POA: Diagnosis not present

## 2020-05-09 DIAGNOSIS — I10 Essential (primary) hypertension: Secondary | ICD-10-CM | POA: Diagnosis not present

## 2020-05-09 DIAGNOSIS — H34812 Central retinal vein occlusion, left eye, with macular edema: Secondary | ICD-10-CM | POA: Diagnosis not present

## 2020-05-21 DIAGNOSIS — H34812 Central retinal vein occlusion, left eye, with macular edema: Secondary | ICD-10-CM | POA: Diagnosis not present

## 2020-05-30 DIAGNOSIS — H34812 Central retinal vein occlusion, left eye, with macular edema: Secondary | ICD-10-CM | POA: Diagnosis not present

## 2020-06-01 DIAGNOSIS — R001 Bradycardia, unspecified: Secondary | ICD-10-CM | POA: Diagnosis not present

## 2020-06-01 DIAGNOSIS — R600 Localized edema: Secondary | ICD-10-CM | POA: Diagnosis not present

## 2020-06-01 DIAGNOSIS — I10 Essential (primary) hypertension: Secondary | ICD-10-CM | POA: Diagnosis not present

## 2020-06-01 DIAGNOSIS — H34812 Central retinal vein occlusion, left eye, with macular edema: Secondary | ICD-10-CM | POA: Diagnosis not present

## 2020-06-01 DIAGNOSIS — H349 Unspecified retinal vascular occlusion: Secondary | ICD-10-CM | POA: Diagnosis not present

## 2020-06-01 DIAGNOSIS — R002 Palpitations: Secondary | ICD-10-CM | POA: Diagnosis not present

## 2020-06-01 DIAGNOSIS — I6523 Occlusion and stenosis of bilateral carotid arteries: Secondary | ICD-10-CM | POA: Diagnosis not present

## 2020-06-27 DIAGNOSIS — H34812 Central retinal vein occlusion, left eye, with macular edema: Secondary | ICD-10-CM | POA: Diagnosis not present

## 2020-07-20 DIAGNOSIS — K922 Gastrointestinal hemorrhage, unspecified: Secondary | ICD-10-CM | POA: Diagnosis not present

## 2020-07-20 DIAGNOSIS — Z791 Long term (current) use of non-steroidal anti-inflammatories (NSAID): Secondary | ICD-10-CM | POA: Diagnosis not present

## 2020-07-20 DIAGNOSIS — Z87891 Personal history of nicotine dependence: Secondary | ICD-10-CM | POA: Diagnosis not present

## 2020-07-20 DIAGNOSIS — N183 Chronic kidney disease, stage 3 unspecified: Secondary | ICD-10-CM | POA: Diagnosis not present

## 2020-07-20 DIAGNOSIS — I499 Cardiac arrhythmia, unspecified: Secondary | ICD-10-CM | POA: Diagnosis not present

## 2020-07-20 DIAGNOSIS — R6889 Other general symptoms and signs: Secondary | ICD-10-CM | POA: Diagnosis not present

## 2020-07-20 DIAGNOSIS — Z9049 Acquired absence of other specified parts of digestive tract: Secondary | ICD-10-CM | POA: Diagnosis not present

## 2020-07-20 DIAGNOSIS — I129 Hypertensive chronic kidney disease with stage 1 through stage 4 chronic kidney disease, or unspecified chronic kidney disease: Secondary | ICD-10-CM | POA: Diagnosis not present

## 2020-07-20 DIAGNOSIS — R58 Hemorrhage, not elsewhere classified: Secondary | ICD-10-CM | POA: Diagnosis not present

## 2020-07-20 DIAGNOSIS — K5750 Diverticulosis of both small and large intestine without perforation or abscess without bleeding: Secondary | ICD-10-CM | POA: Diagnosis not present

## 2020-07-20 DIAGNOSIS — I700 Atherosclerosis of aorta: Secondary | ICD-10-CM | POA: Diagnosis not present

## 2020-07-20 DIAGNOSIS — S22080A Wedge compression fracture of T11-T12 vertebra, initial encounter for closed fracture: Secondary | ICD-10-CM | POA: Diagnosis not present

## 2020-07-20 DIAGNOSIS — K259 Gastric ulcer, unspecified as acute or chronic, without hemorrhage or perforation: Secondary | ICD-10-CM | POA: Diagnosis not present

## 2020-07-20 DIAGNOSIS — K5730 Diverticulosis of large intestine without perforation or abscess without bleeding: Secondary | ICD-10-CM | POA: Diagnosis not present

## 2020-07-20 DIAGNOSIS — R404 Transient alteration of awareness: Secondary | ICD-10-CM | POA: Diagnosis not present

## 2020-07-20 DIAGNOSIS — R112 Nausea with vomiting, unspecified: Secondary | ICD-10-CM | POA: Diagnosis not present

## 2020-07-20 DIAGNOSIS — Z743 Need for continuous supervision: Secondary | ICD-10-CM | POA: Diagnosis not present

## 2020-07-20 DIAGNOSIS — M549 Dorsalgia, unspecified: Secondary | ICD-10-CM | POA: Diagnosis not present

## 2020-07-20 DIAGNOSIS — Z79899 Other long term (current) drug therapy: Secondary | ICD-10-CM | POA: Diagnosis not present

## 2020-07-20 DIAGNOSIS — E871 Hypo-osmolality and hyponatremia: Secondary | ICD-10-CM | POA: Diagnosis not present

## 2020-07-20 DIAGNOSIS — G8929 Other chronic pain: Secondary | ICD-10-CM | POA: Diagnosis not present

## 2020-07-20 DIAGNOSIS — K920 Hematemesis: Secondary | ICD-10-CM | POA: Diagnosis not present

## 2020-07-20 DIAGNOSIS — N179 Acute kidney failure, unspecified: Secondary | ICD-10-CM | POA: Diagnosis not present

## 2020-07-20 DIAGNOSIS — K449 Diaphragmatic hernia without obstruction or gangrene: Secondary | ICD-10-CM | POA: Diagnosis not present

## 2020-07-20 DIAGNOSIS — R197 Diarrhea, unspecified: Secondary | ICD-10-CM | POA: Diagnosis not present

## 2020-07-20 DIAGNOSIS — D5912 Cold autoimmune hemolytic anemia: Secondary | ICD-10-CM | POA: Diagnosis not present

## 2020-07-20 DIAGNOSIS — R7989 Other specified abnormal findings of blood chemistry: Secondary | ICD-10-CM | POA: Diagnosis not present

## 2020-07-21 DIAGNOSIS — N183 Chronic kidney disease, stage 3 unspecified: Secondary | ICD-10-CM | POA: Diagnosis not present

## 2020-07-21 DIAGNOSIS — K920 Hematemesis: Secondary | ICD-10-CM | POA: Diagnosis not present

## 2020-07-21 DIAGNOSIS — K922 Gastrointestinal hemorrhage, unspecified: Secondary | ICD-10-CM | POA: Diagnosis not present

## 2020-07-21 DIAGNOSIS — I129 Hypertensive chronic kidney disease with stage 1 through stage 4 chronic kidney disease, or unspecified chronic kidney disease: Secondary | ICD-10-CM | POA: Diagnosis not present

## 2020-07-21 DIAGNOSIS — E871 Hypo-osmolality and hyponatremia: Secondary | ICD-10-CM | POA: Diagnosis not present

## 2020-08-01 DIAGNOSIS — H34812 Central retinal vein occlusion, left eye, with macular edema: Secondary | ICD-10-CM | POA: Diagnosis not present

## 2020-08-29 DIAGNOSIS — E7800 Pure hypercholesterolemia, unspecified: Secondary | ICD-10-CM | POA: Diagnosis not present

## 2020-09-12 DIAGNOSIS — H34812 Central retinal vein occlusion, left eye, with macular edema: Secondary | ICD-10-CM | POA: Diagnosis not present

## 2020-10-12 ENCOUNTER — Emergency Department (HOSPITAL_BASED_OUTPATIENT_CLINIC_OR_DEPARTMENT_OTHER): Payer: Medicare Other

## 2020-10-12 ENCOUNTER — Other Ambulatory Visit: Payer: Self-pay

## 2020-10-12 ENCOUNTER — Inpatient Hospital Stay (HOSPITAL_BASED_OUTPATIENT_CLINIC_OR_DEPARTMENT_OTHER)
Admission: EM | Admit: 2020-10-12 | Discharge: 2020-10-14 | DRG: 291 | Disposition: A | Discharge status: Home / Self Care | Payer: Medicare Other | Attending: Internal Medicine | Admitting: Internal Medicine

## 2020-10-12 ENCOUNTER — Encounter (HOSPITAL_BASED_OUTPATIENT_CLINIC_OR_DEPARTMENT_OTHER): Payer: Self-pay | Admitting: *Deleted

## 2020-10-12 DIAGNOSIS — I5031 Acute diastolic (congestive) heart failure: Secondary | ICD-10-CM | POA: Diagnosis not present

## 2020-10-12 DIAGNOSIS — I509 Heart failure, unspecified: Secondary | ICD-10-CM | POA: Diagnosis not present

## 2020-10-12 DIAGNOSIS — Z90710 Acquired absence of both cervix and uterus: Secondary | ICD-10-CM | POA: Diagnosis not present

## 2020-10-12 DIAGNOSIS — Z8711 Personal history of peptic ulcer disease: Secondary | ICD-10-CM | POA: Diagnosis not present

## 2020-10-12 DIAGNOSIS — J9600 Acute respiratory failure, unspecified whether with hypoxia or hypercapnia: Secondary | ICD-10-CM | POA: Diagnosis present

## 2020-10-12 DIAGNOSIS — Z7982 Long term (current) use of aspirin: Secondary | ICD-10-CM | POA: Diagnosis not present

## 2020-10-12 DIAGNOSIS — Z888 Allergy status to other drugs, medicaments and biological substances status: Secondary | ICD-10-CM

## 2020-10-12 DIAGNOSIS — R000 Tachycardia, unspecified: Secondary | ICD-10-CM | POA: Diagnosis not present

## 2020-10-12 DIAGNOSIS — Z8249 Family history of ischemic heart disease and other diseases of the circulatory system: Secondary | ICD-10-CM

## 2020-10-12 DIAGNOSIS — I10 Essential (primary) hypertension: Secondary | ICD-10-CM | POA: Diagnosis not present

## 2020-10-12 DIAGNOSIS — I110 Hypertensive heart disease with heart failure: Principal | ICD-10-CM | POA: Diagnosis present

## 2020-10-12 DIAGNOSIS — E785 Hyperlipidemia, unspecified: Secondary | ICD-10-CM | POA: Diagnosis present

## 2020-10-12 DIAGNOSIS — Z881 Allergy status to other antibiotic agents status: Secondary | ICD-10-CM | POA: Diagnosis not present

## 2020-10-12 DIAGNOSIS — R0602 Shortness of breath: Secondary | ICD-10-CM

## 2020-10-12 DIAGNOSIS — D582 Other hemoglobinopathies: Secondary | ICD-10-CM | POA: Diagnosis present

## 2020-10-12 DIAGNOSIS — D479 Neoplasm of uncertain behavior of lymphoid, hematopoietic and related tissue, unspecified: Secondary | ICD-10-CM | POA: Diagnosis not present

## 2020-10-12 DIAGNOSIS — I5033 Acute on chronic diastolic (congestive) heart failure: Secondary | ICD-10-CM | POA: Diagnosis present

## 2020-10-12 DIAGNOSIS — Z87891 Personal history of nicotine dependence: Secondary | ICD-10-CM | POA: Diagnosis not present

## 2020-10-12 DIAGNOSIS — Z885 Allergy status to narcotic agent status: Secondary | ICD-10-CM | POA: Diagnosis not present

## 2020-10-12 DIAGNOSIS — J439 Emphysema, unspecified: Secondary | ICD-10-CM | POA: Diagnosis not present

## 2020-10-12 DIAGNOSIS — E876 Hypokalemia: Secondary | ICD-10-CM | POA: Diagnosis present

## 2020-10-12 DIAGNOSIS — J90 Pleural effusion, not elsewhere classified: Secondary | ICD-10-CM

## 2020-10-12 DIAGNOSIS — R7989 Other specified abnormal findings of blood chemistry: Secondary | ICD-10-CM | POA: Diagnosis not present

## 2020-10-12 DIAGNOSIS — Z20822 Contact with and (suspected) exposure to covid-19: Secondary | ICD-10-CM | POA: Diagnosis not present

## 2020-10-12 DIAGNOSIS — I340 Nonrheumatic mitral (valve) insufficiency: Secondary | ICD-10-CM | POA: Diagnosis not present

## 2020-10-12 DIAGNOSIS — J9811 Atelectasis: Secondary | ICD-10-CM | POA: Diagnosis not present

## 2020-10-12 DIAGNOSIS — I700 Atherosclerosis of aorta: Secondary | ICD-10-CM | POA: Diagnosis not present

## 2020-10-12 DIAGNOSIS — Z79899 Other long term (current) drug therapy: Secondary | ICD-10-CM

## 2020-10-12 DIAGNOSIS — D509 Iron deficiency anemia, unspecified: Secondary | ICD-10-CM | POA: Diagnosis not present

## 2020-10-12 DIAGNOSIS — R0902 Hypoxemia: Secondary | ICD-10-CM | POA: Diagnosis not present

## 2020-10-12 LAB — CBC WITH DIFFERENTIAL/PLATELET
Abs Immature Granulocytes: 1.34 10*3/uL — ABNORMAL HIGH (ref 0.00–0.07)
Basophils Absolute: 0 10*3/uL (ref 0.0–0.1)
Basophils Relative: 0 %
Eosinophils Absolute: 0 10*3/uL (ref 0.0–0.5)
Eosinophils Relative: 0 %
HCT: 25.2 % — ABNORMAL LOW (ref 36.0–46.0)
Hemoglobin: 7.2 g/dL — ABNORMAL LOW (ref 12.0–15.0)
Immature Granulocytes: 5 %
Lymphocytes Relative: 11 %
Lymphs Abs: 2.9 10*3/uL (ref 0.7–4.0)
MCH: 18.2 pg — ABNORMAL LOW (ref 26.0–34.0)
MCHC: 28.6 g/dL — ABNORMAL LOW (ref 30.0–36.0)
MCV: 63.6 fL — ABNORMAL LOW (ref 80.0–100.0)
Monocytes Absolute: 2.5 10*3/uL — ABNORMAL HIGH (ref 0.1–1.0)
Monocytes Relative: 9 %
Neutro Abs: 20.5 10*3/uL — ABNORMAL HIGH (ref 1.7–7.7)
Neutrophils Relative %: 75 %
Platelets: 336 10*3/uL (ref 150–400)
RBC: 3.96 MIL/uL (ref 3.87–5.11)
RDW: 25.3 % — ABNORMAL HIGH (ref 11.5–15.5)
Smear Review: NORMAL
WBC: 27.3 10*3/uL — ABNORMAL HIGH (ref 4.0–10.5)
nRBC: 1.2 % — ABNORMAL HIGH (ref 0.0–0.2)

## 2020-10-12 LAB — URINALYSIS, ROUTINE W REFLEX MICROSCOPIC
Bilirubin Urine: NEGATIVE
Glucose, UA: NEGATIVE mg/dL
Hgb urine dipstick: NEGATIVE
Ketones, ur: NEGATIVE mg/dL
Nitrite: NEGATIVE
Protein, ur: NEGATIVE mg/dL
Specific Gravity, Urine: 1.01 (ref 1.005–1.030)
pH: 7.5 (ref 5.0–8.0)

## 2020-10-12 LAB — TSH: TSH: 2.002 u[IU]/mL (ref 0.350–4.500)

## 2020-10-12 LAB — COMPREHENSIVE METABOLIC PANEL
ALT: 9 U/L (ref 0–44)
AST: 22 U/L (ref 15–41)
Albumin: 4 g/dL (ref 3.5–5.0)
Alkaline Phosphatase: 97 U/L (ref 38–126)
Anion gap: 11 (ref 5–15)
BUN: 8 mg/dL (ref 8–23)
CO2: 23 mmol/L (ref 22–32)
Calcium: 9 mg/dL (ref 8.9–10.3)
Chloride: 100 mmol/L (ref 98–111)
Creatinine, Ser: 0.85 mg/dL (ref 0.44–1.00)
GFR, Estimated: >60 mL/min (ref >60)
Glucose, Bld: 116 mg/dL — ABNORMAL HIGH (ref 70–99)
Potassium: 3.4 mmol/L — ABNORMAL LOW (ref 3.5–5.1)
Sodium: 134 mmol/L — ABNORMAL LOW (ref 135–145)
Total Bilirubin: 1 mg/dL (ref 0.3–1.2)
Total Protein: 7 g/dL (ref 6.5–8.1)

## 2020-10-12 LAB — IRON AND TIBC
Iron: 13 ug/dL — ABNORMAL LOW (ref 28–170)
Saturation Ratios: 3 % — ABNORMAL LOW (ref 10.4–31.8)
TIBC: 374 ug/dL (ref 250–450)
UIBC: 361 ug/dL

## 2020-10-12 LAB — BRAIN NATRIURETIC PEPTIDE: B Natriuretic Peptide: 247.9 pg/mL — ABNORMAL HIGH (ref 0.0–100.0)

## 2020-10-12 LAB — RESP PANEL BY RT-PCR (FLU A&B, COVID) ARPGX2
Influenza A by PCR: NEGATIVE
Influenza B by PCR: NEGATIVE
SARS Coronavirus 2 by RT PCR: NEGATIVE

## 2020-10-12 LAB — LACTIC ACID, PLASMA
Lactic Acid, Venous: 1 mmol/L (ref 0.5–1.9)
Lactic Acid, Venous: 1.5 mmol/L (ref 0.5–1.9)

## 2020-10-12 LAB — T4, FREE: Free T4: 1.27 ng/dL — ABNORMAL HIGH (ref 0.61–1.12)

## 2020-10-12 LAB — URINALYSIS, MICROSCOPIC (REFLEX)

## 2020-10-12 LAB — RETICULOCYTES
Immature Retic Fract: 42.1 % — ABNORMAL HIGH (ref 2.3–15.9)
RBC.: 3.76 MIL/uL — ABNORMAL LOW (ref 3.87–5.11)
Retic Count, Absolute: 115.8 10*3/uL (ref 19.0–186.0)
Retic Ct Pct: 3.1 % (ref 0.4–3.1)

## 2020-10-12 LAB — OCCULT BLOOD X 1 CARD TO LAB, STOOL: Fecal Occult Bld: NEGATIVE

## 2020-10-12 LAB — PREPARE RBC (CROSSMATCH)

## 2020-10-12 MED ORDER — LACTATED RINGERS IV BOLUS (SEPSIS): 500.0000 mL | Freq: Once | INTRAVENOUS | Status: DC

## 2020-10-12 MED ORDER — LOSARTAN POTASSIUM 50 MG PO TABS
100.0000 mg | ORAL_TABLET | Freq: Every day | ORAL | Status: DC
  Administered 2020-10-12: 100 mg via ORAL
  Filled 2020-10-12: qty 2

## 2020-10-12 MED ORDER — SODIUM CHLORIDE 0.9% FLUSH
3.0000 mL | Freq: Two times a day (BID) | INTRAVENOUS | Status: DC
  Administered 2020-10-12: 3 mL via INTRAVENOUS

## 2020-10-12 MED ORDER — DIAZEPAM 5 MG PO TABS: 5.0000 mg | ORAL_TABLET | Freq: Every evening | ORAL | Status: DC | PRN

## 2020-10-12 MED ORDER — ACETAMINOPHEN 325 MG PO TABS
650.0000 mg | ORAL_TABLET | ORAL | Status: DC | PRN
Start: 2020-10-12 — End: 2020-10-14

## 2020-10-12 MED ORDER — ASPIRIN EC 81 MG PO TBEC
81.0000 mg | DELAYED_RELEASE_TABLET | Freq: Every day | ORAL | Status: DC
  Administered 2020-10-12: 81 mg via ORAL
  Filled 2020-10-12: qty 1

## 2020-10-12 MED ORDER — SODIUM CHLORIDE 0.9% FLUSH
3.0000 mL | INTRAVENOUS | Status: DC | PRN
Start: 2020-10-12 — End: 2020-10-13

## 2020-10-12 MED ORDER — CALCIUM CARBONATE-VITAMIN D 500-200 MG-UNIT PO TABS
1.0000 | ORAL_TABLET | Freq: Every day | ORAL | Status: DC
  Administered 2020-10-13 – 2020-10-14 (×2): 1 via ORAL
  Filled 2020-10-12 (×2): qty 1

## 2020-10-12 MED ORDER — LACTATED RINGERS IV SOLN
INTRAVENOUS | Status: DC
Start: 2020-10-12 — End: 2020-10-12

## 2020-10-12 MED ORDER — SODIUM CHLORIDE 0.9 % IV SOLN
250.0000 mL | INTRAVENOUS | Status: DC | PRN
Start: 2020-10-12 — End: 2020-10-13

## 2020-10-12 MED ORDER — FUROSEMIDE 10 MG/ML IJ SOLN
40.0000 mg | Freq: Once | INTRAMUSCULAR | Status: AC
  Administered 2020-10-12: 40 mg via INTRAVENOUS
  Filled 2020-10-12: qty 4

## 2020-10-12 MED ORDER — SODIUM CHLORIDE 0.9% IV SOLUTION: Freq: Once | INTRAVENOUS | Status: DC

## 2020-10-12 MED ORDER — ROSUVASTATIN CALCIUM 5 MG PO TABS
5.0000 mg | ORAL_TABLET | Freq: Every day | ORAL | Status: DC
  Administered 2020-10-12 – 2020-10-14 (×3): 5 mg via ORAL
  Filled 2020-10-12 (×3): qty 1

## 2020-10-12 MED ORDER — METRONIDAZOLE 500 MG/100ML IV SOLN
500.0000 mg | Freq: Once | INTRAVENOUS | Status: AC
  Administered 2020-10-12: 500 mg via INTRAVENOUS
  Filled 2020-10-12: qty 100

## 2020-10-12 MED ORDER — VITAMIN D 25 MCG (1000 UNIT) PO TABS
1000.0000 [IU] | ORAL_TABLET | Freq: Every day | ORAL | Status: DC
  Administered 2020-10-13 – 2020-10-14 (×2): 1000 [IU] via ORAL
  Filled 2020-10-12 (×2): qty 1

## 2020-10-12 MED ORDER — ONDANSETRON HCL 4 MG/2ML IJ SOLN: 4.0000 mg | Freq: Four times a day (QID) | INTRAMUSCULAR | Status: DC | PRN

## 2020-10-12 MED ORDER — LACTATED RINGERS IV BOLUS (SEPSIS): 250.0000 mL | Freq: Once | INTRAVENOUS | Status: DC

## 2020-10-12 MED ORDER — FUROSEMIDE 10 MG/ML IJ SOLN
40.0000 mg | Freq: Every day | INTRAMUSCULAR | Status: DC
  Administered 2020-10-13: 40 mg via INTRAVENOUS
  Filled 2020-10-12: qty 4

## 2020-10-12 MED ORDER — FOLIC ACID 1 MG PO TABS
2.0000 mg | ORAL_TABLET | Freq: Every day | ORAL | Status: DC
  Administered 2020-10-12 – 2020-10-14 (×3): 2 mg via ORAL
  Filled 2020-10-12 (×3): qty 2

## 2020-10-12 MED ORDER — CALCIUM CARBONATE-VIT D-MIN 1200-1000 MG-UNIT PO CHEW: 1.0000 | CHEWABLE_TABLET | Freq: Every day | ORAL | Status: DC

## 2020-10-12 MED ORDER — ASCORBIC ACID 500 MG PO TABS
500.0000 mg | ORAL_TABLET | Freq: Every day | ORAL | Status: DC
  Administered 2020-10-12 – 2020-10-14 (×3): 500 mg via ORAL
  Filled 2020-10-12 (×3): qty 1

## 2020-10-12 MED ORDER — VANCOMYCIN HCL 750 MG/150ML IV SOLN
750.0000 mg | INTRAVENOUS | Status: DC
  Filled 2020-10-12: qty 150

## 2020-10-12 MED ORDER — PANTOPRAZOLE SODIUM 40 MG PO TBEC
40.0000 mg | DELAYED_RELEASE_TABLET | Freq: Two times a day (BID) | ORAL | Status: DC
  Administered 2020-10-12: 40 mg via ORAL
  Filled 2020-10-12: qty 1

## 2020-10-12 MED ORDER — CARVEDILOL 3.125 MG PO TABS: 3.1250 mg | ORAL_TABLET | Freq: Four times a day (QID) | ORAL | Status: DC | PRN

## 2020-10-12 MED ORDER — SODIUM CHLORIDE 0.9 % IV SOLN
2.0000 g | Freq: Once | INTRAVENOUS | Status: AC
  Administered 2020-10-12: 2 g via INTRAVENOUS
  Filled 2020-10-12: qty 2

## 2020-10-12 MED ORDER — POTASSIUM CHLORIDE CRYS ER 20 MEQ PO TBCR
40.0000 meq | EXTENDED_RELEASE_TABLET | Freq: Once | ORAL | Status: AC
  Administered 2020-10-12: 40 meq via ORAL
  Filled 2020-10-12: qty 2

## 2020-10-12 MED ORDER — LACTATED RINGERS IV BOLUS (SEPSIS): 1000.0000 mL | Freq: Once | INTRAVENOUS | Status: DC

## 2020-10-12 MED ORDER — VANCOMYCIN HCL IN DEXTROSE 1-5 GM/200ML-% IV SOLN
1000.0000 mg | Freq: Once | INTRAVENOUS | Status: AC
Start: 2020-10-12 — End: 2020-10-12
  Administered 2020-10-12: 1000 mg via INTRAVENOUS
  Filled 2020-10-12: qty 200

## 2020-10-12 MED ORDER — SODIUM CHLORIDE 0.9 % IV SOLN: 2.0000 g | Freq: Two times a day (BID) | INTRAVENOUS | Status: DC

## 2020-10-12 NOTE — ED Provider Notes (Signed)
I provided a substantive portion of the care of this patient.  I personally performed the entirety of the history, exam, and medical decision making for this encounter.    EKG Interpretation  Date/Time:  Friday October 12 2020 10:13:36 EDT Ventricular Rate:  95 PR Interval:  161 QRS Duration: 87 QT Interval:  364 QTC Calculation: 458 R Axis:   10 Text Interpretation: Sinus rhythm Low voltage, precordial leads No significant change since last tracing Confirmed by Fredia Sorrow [phone]) on 10/12/2020 10:21:11 AM    Fredia Sorrow, MD 10/12/20 1606

## 2020-10-12 NOTE — Progress Notes (Addendum)
Per RN blood cultures were drawn after antibiotics, attempt was made but pt taken to CT. MD placed order to not hold antibiotics.

## 2020-10-12 NOTE — ED Provider Notes (Signed)
[facility] Provider Note   CSN: [id number] Arrival date & time: 10/12/20  [id number]     History Chief Complaint  Patient presents with   Shortness of Breath    Cindy Keller is a 80 y.o. female past medical history significant for hypertension who presents with several days of shortness of breath, especially worse when lying down.  Patient denies history of COPD, emphysema, smoking, asthma.  Patient reports she does not feel like she is more short of breath when she is exerting herself, shortness of breath is worse at night, and when laying down.  Patient denies chest pain, chest tightness.  Patient also endorses some lower leg edema, worse at the end of the day.  Patient denies abdominal pain, nausea, vomiting, dysuria.    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     Family History  Problem Relation Age of Onset   Allergies Mother    Heart disease Mother    Heart disease Father    Breast cancer Sister     Social History   Tobacco Use   Smoking status: Former    Packs/day: 0.50    Years: 30.00    Pack years: 15.00    Types: Cigarettes    Quit date: 05/04/2009    Years since quitting: 11.4   Smokeless tobacco: Never  Substance Use Topics   Alcohol use: No   Drug use: No    Home  Medications Prior to Admission medications   Medication Sig Start Date End Date Taking? Authorizing Provider  Ascorbic Acid (VITAMIN C) 500 MG tablet Take 500 mg by mouth daily.      [provider]  aspirin EC 81 MG tablet Take 81 mg by mouth daily.    [provider]  Calcium Carbonate-Vit D-Min 1200-1000 MG-UNIT CHEW Chew 1 capsule by mouth daily.      [provider]  Cholecalciferol (VITAMIN D) 1000 UNITS capsule Take 1,000 Units by mouth daily.      [provider]  diazepam (VALIUM) 5 MG tablet Take 5 mg by mouth at bedtime as needed.    [provider]  folic acid (FOLVITE) 1 MG tablet TAKE 2 TABLETS BY MOUTH EVERY DAY 04/13/20   Volanda Napoleon, MD  losartan (COZAAR) 100 MG tablet Take 100 mg by mouth daily.    [provider]  Omega-3 Fatty Acids (FISH OIL) 1000 MG CAPS Take 1 capsule by mouth daily.      [provider]  pantoprazole (PROTONIX) 40 MG tablet Take 40 mg by mouth 2 (two) times daily. 09/18/20   [provider]  rosuvastatin (CRESTOR) 5 MG tablet Take 5 mg by mouth daily. 08/22/20   [provider]    Allergies  Benadryl [diphenhydramine hcl], Codeine, and Ciprofloxacin  Review of Systems   Review of Systems  Constitutional:  Positive for appetite change. Negative for fever.  Respiratory:  Positive for shortness of breath. Negative for chest tightness.   Cardiovascular:  Negative for chest pain.  Neurological:  Negative for syncope.  All other systems reviewed and are negative.  Physical Exam Updated Vital Signs BP (!) 147/61 (BP Location: Right Arm)   Pulse 77   Temp 98.2 F (36.8 C) (Oral)   Resp (!) 24   Ht '5\' 3"'$  (1.6 m)   Wt 54 kg   SpO2 93%   BMI 21.08 kg/m   Physical Exam Vitals and nursing note reviewed.  Constitutional:      General: She is not in acute distress.    Appearance: Normal appearance.  HENT:     Head: Normocephalic and atraumatic.  Eyes:      General:        Right eye: No discharge.        Left eye: No discharge.  Cardiovascular:     Rate and Rhythm: Normal rate and regular rhythm.     Heart sounds: No murmur heard.   No friction rub. No gallop.  Pulmonary:     Effort: Pulmonary effort is normal.     Breath sounds: Examination of the left-lower field reveals decreased breath sounds. Decreased breath sounds present.  Abdominal:     General: Bowel sounds are normal.     Palpations: Abdomen is soft.  Musculoskeletal:     Right lower leg: No edema.     Left lower leg: No edema.  Skin:    General: Skin is warm and dry.     Capillary Refill: Capillary refill takes less than 2 seconds.  Neurological:     Mental Status: She is alert and oriented to person, place, and time.  Psychiatric:        Mood and Affect: Mood normal.        Behavior: Behavior normal.    ED Results / Procedures / Treatments   Labs (all labs ordered are listed, but only abnormal results are displayed) Labs Reviewed  CBC WITH DIFFERENTIAL/PLATELET - Abnormal; Notable for the following components:      Result Value   WBC 27.3 (*)    Hemoglobin 7.2 (*)    HCT 25.2 (*)    MCV 63.6 (*)    MCH 18.2 (*)    MCHC 28.6 (*)    RDW 25.3 (*)    nRBC 1.2 (*)    Neutro Abs 20.5 (*)    Monocytes Absolute 2.5 (*)    Abs Immature Granulocytes 1.34 (*)    All other components within normal limits  COMPREHENSIVE METABOLIC PANEL - Abnormal; Notable for the following components:   Sodium 134 (*)    Potassium 3.4 (*)    Glucose, Bld 116 (*)    All other components within normal limits  BRAIN NATRIURETIC PEPTIDE - Abnormal; Notable for the following components:   B Natriuretic Peptide 247.9 (*)    All other components within normal limits  URINALYSIS, ROUTINE W REFLEX MICROSCOPIC - Abnormal; Notable for the following components:   APPearance HAZY (*)    Leukocytes,Ua MODERATE (*)    All other components within normal limits  URINALYSIS, MICROSCOPIC (REFLEX) -  Abnormal; Notable for the following components:   Bacteria, UA FEW (*)    All other components within normal limits  RESP PANEL BY RT-PCR (FLU A&B, COVID) ARPGX2  CULTURE, BLOOD (SINGLE)  LACTIC ACID, PLASMA  OCCULT BLOOD X 1 CARD TO LAB, STOOL  LACTIC ACID, PLASMA  POC OCCULT BLOOD, ED    EKG EKG Interpretation  Date/Time:  Friday October 12 2020 10:13:36 EDT Ventricular Rate:  95 PR Interval:  161 QRS Duration: 87 QT Interval:  364 QTC Calculation: 458 R Axis:   10 Text Interpretation: Sinus rhythm Low voltage, precordial leads No significant change since last tracing Confirmed by Fredia Sorrow [phone]) on 10/12/2020 10:21:11 AM  Radiology DG Chest 2 View  Result Date: 10/12/2020 CLINICAL DATA:  Shortness of breath EXAM: CHEST - 2 VIEW COMPARISON:  Chest radiograph 12/06/2014, CT abdomen/pelvis 02/15/2018, CT chest 10/22/2016 FINDINGS: The heart is mildly enlarged. The mediastinal contours are within normal limits. The pulmonary vasculature is prominent. There are increased interstitial markings throughout both lungs. There are small bilateral pleural effusions with adjacent atelectasis. There is no other focal airspace disease. There is no pneumothorax. There is complete collapse of the T12 vertebral body, new since 2020. IMPRESSION: 1. Findings suspicious for heart failure with mild cardiomegaly, enlarged pulmonary vasculature, mild pulmonary interstitial edema, and small bilateral pleural effusions. 2. Complete collapse of the T12 vertebral body, new since 2020. Electronically Signed   By: Valetta Mole M.D.   On: 10/12/2020 11:25   CT Chest Wo Contrast  Result Date: 10/12/2020 CLINICAL DATA:  Shortness of breath, pneumonia pleural effusions. EXAM: CT CHEST WITHOUT CONTRAST TECHNIQUE: Multidetector CT imaging of the chest was performed following the standard protocol without IV contrast. COMPARISON:  Chest x-ray same date. FINDINGS: Cardiovascular: The heart is within normal limits  in size for age. No pericardial effusion. There is moderate tortuosity, ectasia and calcification of the thoracic aorta but no focal aneurysm. Three-vessel coronary artery calcifications are noted. Mediastinum/Nodes: Borderline enlarged mediastinal and hilar lymph nodes, likely reactive. The esophagus is grossly normal. Lungs/Pleura: Moderate emphysematous changes and areas of pulmonary scarring. There are bilateral pleural effusions, moderate on the right and small on the left. Some overlying atelectasis. No definite infiltrates. No worrisome pulmonary lesions. Upper Abdomen: No significant upper abdominal findings. Advanced atherosclerotic calcifications involving upper abdominal aorta and branch vessels. Musculoskeletal: No breast masses, supraclavicular or axillary adenopathy. The bony thorax is intact. Severe remote compression fracture of T12 with vertebral plana appearance. IMPRESSION: 1. Moderate emphysematous changes and areas of pulmonary scarring. 2. Bilateral pleural effusions, moderate on the right and small on the left with overlying atelectasis. 3. Borderline enlarged mediastinal and hilar lymph nodes, likely reactive. 4. Three-vessel coronary artery calcifications. 5. Severe remote compression fracture of T12 with vertebral plana appearance. Aortic Atherosclerosis (ICD10-I70.0) and Emphysema (ICD10-J43.9). Electronically Signed   By: Marijo Sanes M.D.   On: 10/12/2020 12:58    Procedures .Critical Care Performed by: Anselmo Pickler, PA-C Authorized by: Anselmo Pickler, PA-C   Critical care provider statement:    Critical care time (minutes):  34   Critical care was necessary to treat or prevent imminent or life-threatening deterioration of the following conditions:  Sepsis   Critical care was time spent personally by me on the following activities:  Development of treatment plan with patient or surrogate, ordering and review of laboratory studies, ordering and review of  radiographic studies, ordering and performing treatments and interventions, pulse oximetry, re-evaluation of patient's condition, examination of patient, evaluation of patient's response to treatment and obtaining history from patient or surrogate   Care discussed with: accepting provider at another facility     Medications Ordered in  ED Medications  ceFEPIme (MAXIPIME) 2 g in sodium chloride 0.9 % 100 mL IVPB (has no administration in time range)  vancomycin (VANCOREADY) IVPB 750 mg/150 mL (has no administration in time range)  ceFEPIme (MAXIPIME) 2 g in sodium chloride 0.9 % 100 mL IVPB (0 g Intravenous Stopped 10/12/20 1221)  metroNIDAZOLE (FLAGYL) IVPB 500 mg (0 mg Intravenous Stopped 10/12/20 1314)  vancomycin (VANCOCIN) IVPB 1000 mg/200 mL premix (0 mg Intravenous Stopped 10/12/20 1457)  furosemide (LASIX) injection 40 mg (40 mg Intravenous Given 10/12/20 1239)    ED Course  I have reviewed the triage vital signs and the nursing notes.  Pertinent labs & imaging results that were available during my care of the patient were reviewed by me and considered in my medical decision making (see chart for details).    MDM Rules/Calculators/A&P                         Patient with clinical signs and symptoms of new onset heart failure.  Patient is experiencing some lower extremity edema especially in the evenings.  Patient is having shortness of breath when lying down, denies shortness of breath with exertion.  Lab work reveals elevated BNP, chest x-ray, chest CT show bilateral pleural effusions, without any evidence of pneumonia.  Patient has evidence of sepsis on arrival, with tachycardia, tachypnea, and a white count of 27.3.  On further investigation patient has history of blood work abnormalities, had a white count of 46 during her hospitalization in June.  Additionally she has dropped 5 points in her hemoglobin since she was seen a month ago, Hemoccult is negative, patient denies any vaginal bleeding  blood in her urine.   Is clinically stable with some tachypnea on reevaluation.  No evidence of source for an infection despite elevated white count, sepsis criteria.  In the context of her previous blood dyscrasias, her white count may be misleading in the context of her true presentation.  Significant anemia down 5 points since a month ago without any obvious source, Hemoccult negative as discussed above.  In context of new heart failure, SPO2 desaturation to 90%, with fluid on lungs, and requirement for IV Lasix, this patient should be hospitalized at this time.  Consult placed to hospitalist hospitalist accepts admission. Final Clinical Impression(s) / ED Diagnoses Final diagnoses:  Acute heart failure, unspecified heart failure type ([place])  Pleural effusion    Rx / DC Orders ED Discharge Orders     None        Anselmo Pickler, PA-C 10/12/20 1612    Fredia Sorrow, MD 10/17/20 1601

## 2020-10-12 NOTE — ED Notes (Signed)
ED TO INPATIENT HANDOFF REPORT  ED Nurse Name and Phone #: Vicente Serene RN   S Name/Age/Gender Cindy Keller 80 y.o. female Room/Bed: MH10/MH10  Code Status   Code Status: Not on file  Home/SNF/Other Home Patient oriented to: self, place, time, and situation Is this baseline? Yes   Triage Complete: Triage complete  Chief Complaint CHF exacerbation ([place]) [I50.9]  Triage Note Pt is here for sob which "feels like there is phlegm in my thoat" and also pt reports swelling in both legs and sob gets worse when laying down. No CP   Allergies Allergies  Allergen Reactions   Benadryl [Diphenhydramine Hcl] Other (See Comments)    restless   Codeine Other (See Comments)    restless jittery    Ciprofloxacin Rash and Hives    Level of Care/Admitting Diagnosis ED Disposition     ED Disposition  Admit   Condition  --   Comment  Hospital Area: [facility] [[id number]]  Level of Care: Telemetry [5]  Admit to tele based on following criteria: Acute CHF  May admit patient to [facility] or [facility] if equivalent level of care is available:: Yes  Interfacility transfer: Yes  Covid Evaluation: Confirmed COVID Negative  Diagnosis: CHF exacerbation [facility]) [id number]  Admitting Physician: Tawni Millers [id number]  Attending Physician: Tawni Millers [id number]  Estimated length of stay: 3 - 4 days  Certification:: I certify this patient will need inpatient services for at least 2 midnights          B Medical/Surgery History Past Medical History:  Diagnosis Date   Chest pain    Dyspareunia    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   Hyperlipidemia    Hypertension    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   Past Surgical History:  Procedure Laterality Date   [place]     A IV Location/Drains/Wounds Patient Lines/Drains/Airways Status      Active Line/Drains/Airways     Name Placement date Placement time Site Days   Peripheral IV 10/12/20 20 G 1.16" Left Antecubital 10/12/20  1019  Antecubital  less than 1            Intake/Output Last 24 hours  Intake/Output Summary (Last 24 hours) at 10/12/2020 1557 Last data filed at 10/12/2020 1457 Gross per 24 hour  Intake 338.44 ml  Output --  Net 338.44 ml    Labs/Imaging Results for orders placed or performed during the hospital encounter of 10/12/20 (from the past 48 hour(s))  CBC with Differential     Status: Abnormal   Collection Time: 10/12/20 10:15 AM  Result Value Ref Range   WBC 27.3 (H) 4.0 - 10.5 K/uL   RBC 3.96 3.87 - 5.11 MIL/uL   Hemoglobin 7.2 (L) 12.0 - 15.0 g/dL    Comment: Reticulocyte Hemoglobin testing may be clinically indicated, consider ordering this additional test [id number]    HCT 25.2 (L) 36.0 - 46.0 %   MCV 63.6 (L) 80.0 - 100.0 fL   MCH 18.2 (L) 26.0 - 34.0 pg   MCHC 28.6 (L) 30.0 - 36.0 g/dL   RDW 25.3 (H) 11.5 - 15.5 %   Platelets 336 150 - 400 K/uL    Comment: REPEATED TO VERIFY   nRBC 1.2 (H) 0.0 - 0.2 %   Neutrophils Relative % 75 %   Neutro Abs 20.5 (H) 1.7 - 7.7 K/uL   Lymphocytes Relative 11 %  Lymphs Abs 2.9 0.7 - 4.0 K/uL   Monocytes Relative 9 %   Monocytes Absolute 2.5 (H) 0.1 - 1.0 K/uL   Eosinophils Relative 0 %   Eosinophils Absolute 0.0 0.0 - 0.5 K/uL   Basophils Relative 0 %   Basophils Absolute 0.0 0.0 - 0.1 K/uL   WBC Morphology MILD LEFT SHIFT (1-5% METAS, OCC MYELO, OCC BANDS)    RBC Morphology MORPHOLOGY UNREMARKABLE    Smear Review Normal platelet morphology    Immature Granulocytes 5 %   Abs Immature Granulocytes 1.34 (H) 0.00 - 0.07 K/uL    Comment: Performed at [facility], [address] [place], [place] [address]  Comprehensive metabolic panel     Status: Abnormal   Collection Time: 10/12/20 10:15 AM  Result Value Ref Range   Sodium 134 (L) 135 - 145 mmol/L   Potassium 3.4 (L) 3.5 - 5.1  mmol/L   Chloride 100 98 - 111 mmol/L   CO2 23 22 - 32 mmol/L   Glucose, Bld 116 (H) 70 - 99 mg/dL    Comment: Glucose reference range applies only to samples taken after fasting for at least 8 hours.   BUN 8 8 - 23 mg/dL   Creatinine, Ser 0.85 0.44 - 1.00 mg/dL   Calcium 9.0 8.9 - 10.3 mg/dL   Total Protein 7.0 6.5 - 8.1 g/dL   Albumin 4.0 3.5 - 5.0 g/dL   AST 22 15 - 41 U/L   ALT 9 0 - 44 U/L   Alkaline Phosphatase 97 38 - 126 U/L   Total Bilirubin 1.0 0.3 - 1.2 mg/dL   GFR, Estimated >60 >60 mL/min    Comment: (NOTE) Calculated using the CKD-EPI Creatinine Equation (2021)    Anion gap 11 5 - 15    Comment: Performed at [facility], [address] [place], [place] [address]  Brain natriuretic peptide     Status: Abnormal   Collection Time: 10/12/20 10:15 AM  Result Value Ref Range   B Natriuretic Peptide 247.9 (H) 0.0 - 100.0 pg/mL    Comment: Performed at [facility], [address] [place], [place] [address]  Lactic acid, plasma     Status: None   Collection Time: 10/12/20 11:35 AM  Result Value Ref Range   Lactic Acid, Venous 1.0 0.5 - 1.9 mmol/L    Comment: Performed at [facility], [address] [place], [place] [address]  Urinalysis, Routine w reflex microscopic Urine, Clean Catch     Status: Abnormal   Collection Time: 10/12/20 11:36 AM  Result Value Ref Range   Color, Urine YELLOW YELLOW   APPearance HAZY (A) CLEAR   Specific Gravity, Urine 1.010 1.005 - 1.030   pH 7.5 5.0 - 8.0   Glucose, UA NEGATIVE NEGATIVE mg/dL   Hgb urine dipstick NEGATIVE NEGATIVE   Bilirubin Urine NEGATIVE NEGATIVE   Ketones, ur NEGATIVE NEGATIVE mg/dL   Protein, ur NEGATIVE NEGATIVE mg/dL   Nitrite NEGATIVE NEGATIVE   Leukocytes,Ua MODERATE (A) NEGATIVE    Comment: Performed at [facility], [address] [place], [place] [address]  Urinalysis, Microscopic (reflex)     Status: Abnormal   Collection Time: 10/12/20 11:36 AM  Result  Value Ref Range   RBC / HPF 0-5 0 - 5 RBC/hpf   WBC, UA 0-5 0 - 5 WBC/hpf   Bacteria, UA FEW (A) NONE SEEN   Squamous Epithelial / LPF  6-10 0 - 5    Comment: Performed at [facility], [address] [place], [place] [address]  Resp Panel by RT-PCR (Flu A&B, Covid) Urine, Clean Catch     Status: None   Collection Time: 10/12/20 11:47 AM   Specimen: Urine, Clean Catch; Nasopharyngeal(NP) swabs in vial transport medium  Result Value Ref Range   SARS Coronavirus 2 by RT PCR NEGATIVE NEGATIVE    Comment: (NOTE) SARS-CoV-2 target nucleic acids are NOT DETECTED.  The SARS-CoV-2 RNA is generally detectable in upper respiratory specimens during the acute phase of infection. The lowest concentration of SARS-CoV-2 viral copies this assay can detect is 138 copies/mL. A negative result does not preclude SARS-Cov-2 infection and should not be used as the sole basis for treatment or other patient management decisions. A negative result may occur with  improper specimen collection/handling, submission of specimen other than nasopharyngeal swab, presence of viral mutation(s) within the areas targeted by this assay, and inadequate number of viral copies(<138 copies/mL). A negative result must be combined with clinical observations, patient history, and epidemiological information. The expected result is Negative.  Fact Sheet for Patients:  EntrepreneurPulse.com.au  Fact Sheet for Healthcare Providers:  IncredibleEmployment.be  This test is no t yet approved or cleared by the [place] FDA and  has been authorized for detection and/or diagnosis of SARS-CoV-2 by FDA under an Emergency Use Authorization (EUA). This EUA will remain  in effect (meaning this test can be used) for the duration of the COVID-19 declaration under Section 564(b)(1) of the Act, 21 U.S.C.section 360bbb-3(b)(1), unless the authorization is terminated  or revoked sooner.        Influenza A by PCR NEGATIVE NEGATIVE   Influenza B by PCR NEGATIVE NEGATIVE    Comment: (NOTE) The Xpert Xpress SARS-CoV-2/FLU/RSV plus assay is intended as an aid in the diagnosis of influenza from Nasopharyngeal swab specimens and should not be used as a sole basis for treatment. Nasal washings and aspirates are unacceptable for Xpert Xpress SARS-CoV-2/FLU/RSV testing.  Fact Sheet for Patients: EntrepreneurPulse.com.au  Fact Sheet for Healthcare Providers: IncredibleEmployment.be  This test is not yet approved or cleared by the [place] FDA and has been authorized for detection and/or diagnosis of SARS-CoV-2 by FDA under an Emergency Use Authorization (EUA). This EUA will remain in effect (meaning this test can be used) for the duration of the COVID-19 declaration under Section 564(b)(1) of the Act, 21 U.S.C. section 360bbb-3(b)(1), unless the authorization is terminated or revoked.  Performed at [facility], [address] [place], [place] [address]   Occult blood card to lab, stool     Status: None   Collection Time: 10/12/20 12:35 PM  Result Value Ref Range   Fecal Occult Bld NEGATIVE NEGATIVE    Comment: Performed at [facility], [address] [place], [place] [address]   DG Chest 2 View  Result Date: 10/12/2020 CLINICAL DATA:  Shortness of breath EXAM: CHEST - 2 VIEW COMPARISON:  Chest radiograph 12/06/2014, CT abdomen/pelvis 02/15/2018, CT chest 10/22/2016 FINDINGS: The heart is mildly enlarged. The mediastinal contours are within normal limits. The pulmonary vasculature is prominent. There are increased interstitial markings throughout both lungs. There are small bilateral pleural effusions with adjacent atelectasis. There is no other focal airspace disease. There is no pneumothorax. There is complete collapse of the T12 vertebral body, new since 2020. IMPRESSION: 1. Findings suspicious for heart  failure with mild cardiomegaly, enlarged pulmonary vasculature,  mild pulmonary interstitial edema, and small bilateral pleural effusions. 2. Complete collapse of the T12 vertebral body, new since 2020. Electronically Signed   By: Valetta Mole M.D.   On: 10/12/2020 11:25   CT Chest Wo Contrast  Result Date: 10/12/2020 CLINICAL DATA:  Shortness of breath, pneumonia pleural effusions. EXAM: CT CHEST WITHOUT CONTRAST TECHNIQUE: Multidetector CT imaging of the chest was performed following the standard protocol without IV contrast. COMPARISON:  Chest x-ray same date. FINDINGS: Cardiovascular: The heart is within normal limits in size for age. No pericardial effusion. There is moderate tortuosity, ectasia and calcification of the thoracic aorta but no focal aneurysm. Three-vessel coronary artery calcifications are noted. Mediastinum/Nodes: Borderline enlarged mediastinal and hilar lymph nodes, likely reactive. The esophagus is grossly normal. Lungs/Pleura: Moderate emphysematous changes and areas of pulmonary scarring. There are bilateral pleural effusions, moderate on the right and small on the left. Some overlying atelectasis. No definite infiltrates. No worrisome pulmonary lesions. Upper Abdomen: No significant upper abdominal findings. Advanced atherosclerotic calcifications involving upper abdominal aorta and branch vessels. Musculoskeletal: No breast masses, supraclavicular or axillary adenopathy. The bony thorax is intact. Severe remote compression fracture of T12 with vertebral plana appearance. IMPRESSION: 1. Moderate emphysematous changes and areas of pulmonary scarring. 2. Bilateral pleural effusions, moderate on the right and small on the left with overlying atelectasis. 3. Borderline enlarged mediastinal and hilar lymph nodes, likely reactive. 4. Three-vessel coronary artery calcifications. 5. Severe remote compression fracture of T12 with vertebral plana appearance. Aortic Atherosclerosis (ICD10-I70.0)  and Emphysema (ICD10-J43.9). Electronically Signed   By: Marijo Sanes M.D.   On: 10/12/2020 12:58    Pending Labs Unresulted Labs (From admission, onward)     Start     Ordered   10/12/20 1132  Lactic acid, plasma  Now then every 2 hours,   STAT      10/12/20 1131   10/12/20 1127  Culture, blood (single)  (Undifferentiated -> Now sepsis confirmed (treatment and sepsis specific nursing orders))  ONCE - STAT,   STAT        10/12/20 1127            Vitals/Pain Today's Vitals   10/12/20 1245 10/12/20 1318 10/12/20 1330 10/12/20 1539  BP: (!) 173/83 (!) 169/90  (!) 147/61  Pulse: 96 100 100 77  Resp: (!) 32 (!) 26 (!) 26 (!) 24  Temp:      TempSrc:      SpO2: 93% 92% 93% 93%  Weight:      Height:      PainSc:        Isolation Precautions Airborne and Contact precautions  Medications Medications  ceFEPIme (MAXIPIME) 2 g in sodium chloride 0.9 % 100 mL IVPB (has no administration in time range)  vancomycin (VANCOREADY) IVPB 750 mg/150 mL (has no administration in time range)  ceFEPIme (MAXIPIME) 2 g in sodium chloride 0.9 % 100 mL IVPB (0 g Intravenous Stopped 10/12/20 1221)  metroNIDAZOLE (FLAGYL) IVPB 500 mg (0 mg Intravenous Stopped 10/12/20 1314)  vancomycin (VANCOCIN) IVPB 1000 mg/200 mL premix (0 mg Intravenous Stopped 10/12/20 1457)  furosemide (LASIX) injection 40 mg (40 mg Intravenous Given 10/12/20 1239)    Mobility walks Low fall risk   Focused Assessments Cardiac Assessment Handoff:  Cardiac Rhythm: Normal sinus rhythm No results found for: CKTOTAL, CKMB, CKMBINDEX, TROPONINI No results found for: DDIMER Does the Patient currently have chest pain? No   , Neuro Assessment Handoff:  Swallow screen pass? Yes  Cardiac Rhythm: Normal  sinus rhythm         Has TPA been given? No If patient is a Neuro Trauma and patient is going to OR before floor call report to [place] nurse: [phone] or [phone]  , Pulmonary Assessment Handoff:  Lung sounds:  Bilateral Breath Sounds: Clear L Breath Sounds: Clear R Breath Sounds: Clear O2 Device: Room Air      R Recommendations: See Admitting Provider Note  Report given to: [place] RN  Additional Notes:

## 2020-10-12 NOTE — ED Notes (Signed)
[facility] aware of bed ready status.

## 2020-10-12 NOTE — Sepsis Progress Note (Signed)
Sepsis protocol monitored by [facility] 

## 2020-10-12 NOTE — ED Notes (Signed)
Patient transported to CT 

## 2020-10-12 NOTE — ED Triage Notes (Addendum)
Pt is here for sob which "feels like there is phlegm in my thoat" and also pt reports swelling in both legs and sob gets worse when laying down. No CP

## 2020-10-12 NOTE — Progress Notes (Addendum)
Blood Bank called this RN regarding delay in blood prep due to type and screen results. Pt will need 2nd type drawn. Will continue to monitor.

## 2020-10-12 NOTE — Progress Notes (Signed)
Pharmacy Antibiotic Note  Cindy Keller is a 80 y.o. female admitted on 10/12/2020 with sepsis.  Pharmacy has been consulted for vancomycin and cefepime dosing. Pt is afebrile and WBC is elevated at 27.3. SCr is WNL at 0.85.   Plan: Vanc 1g IV x 1 then '750mg'$  IV Q24H  Cefepime 2gm IV Q12H F/u renal fxn, C&S, clinical status and peak/trough at SS  Weight: 54 kg (119 lb)  Temp (24hrs), Avg:98.2 F (36.8 C), Min:98.2 F (36.8 C), Max:98.2 F (36.8 C)  Recent Labs  Lab 10/12/20 1015  WBC 27.3*  CREATININE 0.85    Estimated Creatinine Clearance: 42.9 mL/min (by C-G formula based on SCr of 0.85 mg/dL).    Allergies  Allergen Reactions   Benadryl [Diphenhydramine Hcl]     restless   Codeine     restless   Ciprofloxacin Rash    Antimicrobials this admission: Vanc 9/9>> Cefepime 9/9>> Flagyl x 1 9/9  Dose adjustments this admission: N/A  Microbiology results: Pending  Thank you for allowing pharmacy to be a part of this patient's care.  Susumu Hackler, Rande Lawman 10/12/2020 12:01 PM

## 2020-10-12 NOTE — H&P (Signed)
History and Physical    Cindy Keller DOB: 10-Apr-1940 DOA: 10/12/2020  PCP: Leighton Ruff, MD (Confirm with patient/family/NH records and if not entered, this has to be entered at [facility] point of entry) Patient coming from: Home  I have personally briefly reviewed patient's old medical records in [place]  Chief Complaint: SOB  HPI: Cindy Keller is a 80 y.o. 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  Symptoms started one week ago and gradually getting worse. Mainly exertional, denied any cough, no chest pain, no fever or chills. She also reported episodes of SOB at night and leg swelling for about 7 days too. No black tarry stool, abd pain, no N/V. No fever or chills.  IN July this years, patient developed sudden onset of hematemesis x2 days, went to [facility], EGD done at that time showed bleeding gastric ulcers x2, and her Hb at that time was 7.4 (last year, her Hb was 12.9 at baseline). H. Pylori was negative and patient was started on PPI. She does developed a new symptom of hoarseness, persistent since the EGD.  ED Course: BP significantly elevated SBP 160-180s, Hb 7.2, WBC 27.3, compared to 46 in last month. CT chest showed mild B/L pleural effusion. 40 mg Lasix given. Patient was also given Vanco and Cefepime for suspected sepsis. UA negative.  Review of Systems: As per HPI otherwise 14 point review of systems negative.    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 Her smoking use included cigarettes. She has a 15.00 pack-year smoking history. She has never used smokeless tobacco. She reports that she does not drink alcohol and does not use drugs.  Allergies  Allergen Reactions   Benadryl [Diphenhydramine Hcl] Other (See Comments)    restless   Codeine Other (See Comments)    restless jittery    Ciprofloxacin Rash and Hives    Family History  Problem Relation Age of Onset   Allergies Mother    Heart disease Mother    Heart disease Father    Breast cancer Sister      Prior to Admission medications   Medication Sig Start Date End Date Taking? Authorizing Provider  Ascorbic Acid (VITAMIN C) 500 MG tablet Take 500 mg by mouth daily.      [provider]  aspirin EC 81 MG tablet Take 81 mg by mouth daily.    [provider]  Calcium Carbonate-Vit D-Min 1200-1000 MG-UNIT CHEW Chew 1 capsule by mouth daily.      [provider]  Cholecalciferol (VITAMIN D) 1000 UNITS capsule Take 1,000 Units by mouth daily.      [provider]  diazepam (VALIUM) 5 MG tablet Take 5 mg by mouth at bedtime as needed.    [provider]  folic acid (FOLVITE) 1 MG tablet TAKE 2 TABLETS BY MOUTH EVERY DAY 04/13/20   Volanda Napoleon, MD  losartan (COZAAR) 100  MG tablet Take 100 mg by mouth daily.    [provider]  Omega-3 Fatty Acids (FISH OIL) 1000 MG CAPS Take 1 capsule by mouth daily.      [provider]  pantoprazole (PROTONIX) 40 MG tablet Take 40 mg by mouth 2 (two) times daily. 09/18/20   [provider]  rosuvastatin (CRESTOR) 5 MG tablet Take 5 mg by mouth daily. 08/22/20   [provider]    Physical Exam: Vitals:   10/12/20 1318 10/12/20 1330 10/12/20 1539 10/12/20 1645  BP: (!) 169/90  (!) 147/61 (!) 164/79  Pulse: 100 100 77 97  Resp: (!) 26 (!) 26 (!) 24 18  Temp:    97.8 F (36.6 C)   TempSrc:    Oral  SpO2: 92% 93% 93% 96%  Weight:    52.3 kg  Height:    '5\' 3"'$  (1.6 m)    Constitutional: NAD, calm, comfortable Vitals:   10/12/20 1318 10/12/20 1330 10/12/20 1539 10/12/20 1645  BP: (!) 169/90  (!) 147/61 (!) 164/79  Pulse: 100 100 77 97  Resp: (!) 26 (!) 26 (!) 24 18  Temp:    97.8 F (36.6 C)  TempSrc:    Oral  SpO2: 92% 93% 93% 96%  Weight:    52.3 kg  Height:    '5\' 3"'$  (1.6 m)   Eyes: PERRL, lids and conjunctivae normal ENMT: Mucous membranes are moist. Posterior pharynx clear of any exudate or lesions.Normal dentition.  Neck: normal, supple, no masses, no thyromegaly Respiratory: clear to auscultation bilaterally, no wheezing, fine crackles on B/L lower fields. Increasing respiratory effort. No accessory muscle use.  Cardiovascular: Regular rate and rhythm, no murmurs / rubs / gallops. 2+ extremity edema. 2+ pedal pulses. No carotid bruits.  Abdomen: no tenderness, no masses palpated. No hepatosplenomegaly. Bowel sounds positive.  Musculoskeletal: no clubbing / cyanosis. No joint deformity upper and lower extremities. Good ROM, no contractures. Normal muscle tone.  Skin: no rashes, lesions, ulcers. No induration Neurologic: CN 2-12 grossly intact. Sensation intact, DTR normal. Strength 5/5 in all 4.  Psychiatric: Normal judgment and insight. Alert and oriented x 3. Normal mood.     Labs on Admission: I have personally reviewed following labs and imaging studies  CBC: Recent Labs  Lab 10/12/20 1015  WBC 27.3*  NEUTROABS 20.5*  HGB 7.2*  HCT 25.2*  MCV 63.6*  PLT [id number]   Basic Metabolic Panel: Recent Labs  Lab 10/12/20 1015  NA 134*  K 3.4*  CL 100  CO2 23  GLUCOSE 116*  BUN 8  CREATININE 0.85  CALCIUM 9.0   GFR: Estimated Creatinine Clearance: 42.9 mL/min (by C-G formula based on SCr of 0.85 mg/dL). Liver Function Tests: Recent Labs  Lab 10/12/20 1015  AST 22  ALT 9  ALKPHOS 97  BILITOT 1.0  PROT 7.0  ALBUMIN 4.0   No results  for input(s): LIPASE, AMYLASE in the last 168 hours. No results for input(s): AMMONIA in the last 168 hours. Coagulation Profile: No results for input(s): INR, PROTIME in the last 168 hours. Cardiac Enzymes: No results for input(s): CKTOTAL, CKMB, CKMBINDEX, TROPONINI in the last 168 hours. BNP (last 3 results) No results for input(s): PROBNP in the last 8760 hours. HbA1C: No results for input(s): HGBA1C in the last 72 hours. CBG: No results for input(s): GLUCAP in the last 168 hours. Lipid Profile: No results for input(s): CHOL, HDL, LDLCALC, TRIG, CHOLHDL, LDLDIRECT in the last 72 hours. Thyroid Function Tests:  No results for input(s): TSH, T4TOTAL, FREET4, T3FREE, THYROIDAB in the last 72 hours. Anemia Panel: No results for input(s): VITAMINB12, FOLATE, FERRITIN, TIBC, IRON, RETICCTPCT in the last 72 hours. Urine analysis:    Component Value Date/Time   COLORURINE YELLOW 10/12/2020 1136   APPEARANCEUR HAZY (A) 10/12/2020 1136   LABSPEC 1.010 10/12/2020 1136   PHURINE 7.5 10/12/2020 1136   GLUCOSEU NEGATIVE 10/12/2020 1136   HGBUR NEGATIVE 10/12/2020 1136   BILIRUBINUR NEGATIVE 10/12/2020 1136   KETONESUR NEGATIVE 10/12/2020 1136   PROTEINUR NEGATIVE 10/12/2020 1136   NITRITE NEGATIVE 10/12/2020 1136   LEUKOCYTESUR MODERATE (A) 10/12/2020 1136    Radiological Exams on Admission: DG Chest 2 View  Result Date: 10/12/2020 CLINICAL DATA:  Shortness of breath EXAM: CHEST - 2 VIEW COMPARISON:  Chest radiograph 12/06/2014, CT abdomen/pelvis 02/15/2018, CT chest 10/22/2016 FINDINGS: The heart is mildly enlarged. The mediastinal contours are within normal limits. The pulmonary vasculature is prominent. There are increased interstitial markings throughout both lungs. There are small bilateral pleural effusions with adjacent atelectasis. There is no other focal airspace disease. There is no pneumothorax. There is complete collapse of the T12 vertebral body, new since 2020. IMPRESSION: 1.  Findings suspicious for heart failure with mild cardiomegaly, enlarged pulmonary vasculature, mild pulmonary interstitial edema, and small bilateral pleural effusions. 2. Complete collapse of the T12 vertebral body, new since 2020. Electronically Signed   By: Valetta Mole M.D.   On: 10/12/2020 11:25   CT Chest Wo Contrast  Result Date: 10/12/2020 CLINICAL DATA:  Shortness of breath, pneumonia pleural effusions. EXAM: CT CHEST WITHOUT CONTRAST TECHNIQUE: Multidetector CT imaging of the chest was performed following the standard protocol without IV contrast. COMPARISON:  Chest x-ray same date. FINDINGS: Cardiovascular: The heart is within normal limits in size for age. No pericardial effusion. There is moderate tortuosity, ectasia and calcification of the thoracic aorta but no focal aneurysm. Three-vessel coronary artery calcifications are noted. Mediastinum/Nodes: Borderline enlarged mediastinal and hilar lymph nodes, likely reactive. The esophagus is grossly normal. Lungs/Pleura: Moderate emphysematous changes and areas of pulmonary scarring. There are bilateral pleural effusions, moderate on the right and small on the left. Some overlying atelectasis. No definite infiltrates. No worrisome pulmonary lesions. Upper Abdomen: No significant upper abdominal findings. Advanced atherosclerotic calcifications involving upper abdominal aorta and branch vessels. Musculoskeletal: No breast masses, supraclavicular or axillary adenopathy. The bony thorax is intact. Severe remote compression fracture of T12 with vertebral plana appearance. IMPRESSION: 1. Moderate emphysematous changes and areas of pulmonary scarring. 2. Bilateral pleural effusions, moderate on the right and small on the left with overlying atelectasis. 3. Borderline enlarged mediastinal and hilar lymph nodes, likely reactive. 4. Three-vessel coronary artery calcifications. 5. Severe remote compression fracture of T12 with vertebral plana appearance. Aortic  Atherosclerosis (ICD10-I70.0) and Emphysema (ICD10-J43.9). Electronically Signed   By: Marijo Sanes M.D.   On: 10/12/2020 12:58    EKG: Independently reviewed. Normal sinus rhythm, no acute ST-T changes.  Assessment/Plan Active Problems:   CHF exacerbation (HCC)   CHF (congestive heart failure) ([place])  (please populate well all problems here in Problem List. (For example, if patient is on BP meds at home and you resume or decide to hold them, it is a problem that needs to be her. Same for CAD, COPD, HLD and so on)  Acute (probably on chronic) diastolic CHF decompensation. -Echo done 10 years back showed mild concentric hypertrophy which implying underlying diastolic CHF at baseline. Her BP has been stable with SBP usually in  120s compared to today's reading of SBP 160-190s, suspect the elevated BP likely from persistent anemia, which lead to high output CHF there. -Still has signs of fluid overload, decided to continue IV diuresis at least one more day. -Echo -More stringent control of BP, starting PRN Coreg, adjust dose according to response. -Regarding her anemia, I propose giving her one unit of PRBC, and explained benefit and risks to the patient, and patient agreed with transfusion. -Hold chemical DVT prophylaxis until repeat Hb stable.  HTN, uncontrolled. -As above, continue lorsartan, IV Lasix, PRN Coreg for now.  Mild hypokalemia -PO replacement.  Chronic microcytic anemia -Send iron study. -PRBC as above.  Chronic leukocytosis -Secondary to underlying lymphoproliferative disorder, differential mild left shift, but given overall clinical picture, active infection is of low suspicion. Will D/C all antibiotics.  Gastric ulcer -Continue PPI.  Hemoglobin AC trait -Has been stable, no Hx of hemolysis recent years. Hold off hemolysis study unless iron study not revealing.  DVT prophylaxis: SCD Code Status: Full Code Family Communication: None at bedside Disposition Plan:  expect more than 2 midnight hospital stay for aggressive diuresis. Consults called: None Admission status: Tele admit   Lequita Halt MD [facility] Pager [phone]  10/12/2020, 6:19 PM

## 2020-10-13 ENCOUNTER — Inpatient Hospital Stay (HOSPITAL_COMMUNITY): Payer: Medicare Other

## 2020-10-13 DIAGNOSIS — R0602 Shortness of breath: Secondary | ICD-10-CM

## 2020-10-13 DIAGNOSIS — I5031 Acute diastolic (congestive) heart failure: Secondary | ICD-10-CM

## 2020-10-13 DIAGNOSIS — R7989 Other specified abnormal findings of blood chemistry: Secondary | ICD-10-CM

## 2020-10-13 LAB — DIC (DISSEMINATED INTRAVASCULAR COAGULATION)PANEL
D-Dimer, Quant: 2.53 ug/mL-FEU — ABNORMAL HIGH (ref 0.00–0.50)
Fibrinogen: 334 mg/dL (ref 210–475)
INR: 1.4 — ABNORMAL HIGH (ref 0.8–1.2)
Platelets: 320 10*3/uL (ref 150–400)
Prothrombin Time: 16.9 seconds — ABNORMAL HIGH (ref 11.4–15.2)
Smear Review: NONE SEEN
aPTT: 34 seconds (ref 24–36)

## 2020-10-13 LAB — RETICULOCYTES
Immature Retic Fract: 43.8 % — ABNORMAL HIGH (ref 2.3–15.9)
RBC.: 3.85 MIL/uL — ABNORMAL LOW (ref 3.87–5.11)
Retic Count, Absolute: 56 10*3/uL (ref 19.0–186.0)
Retic Ct Pct: 2.8 % (ref 0.4–3.1)

## 2020-10-13 LAB — CBC
HCT: 20.9 % — ABNORMAL LOW (ref 36.0–46.0)
HCT: 25.2 % — ABNORMAL LOW (ref 36.0–46.0)
HCT: 30.3 % — ABNORMAL LOW (ref 36.0–46.0)
HCT: 28.8 % — ABNORMAL LOW (ref 36.0–46.0)
Hemoglobin: 6.3 g/dL — CL (ref 12.0–15.0)
Hemoglobin: 8 g/dL — ABNORMAL LOW (ref 12.0–15.0)
Hemoglobin: 8.6 g/dL — ABNORMAL LOW (ref 12.0–15.0)
Hemoglobin: 9 g/dL — ABNORMAL LOW (ref 12.0–15.0)
MCH: 19.3 pg — ABNORMAL LOW (ref 26.0–34.0)
MCH: 21.9 pg — ABNORMAL LOW (ref 26.0–34.0)
MCH: 19.2 pg — ABNORMAL LOW (ref 26.0–34.0)
MCH: 21.2 pg — ABNORMAL LOW (ref 26.0–34.0)
MCHC: 30.1 g/dL (ref 30.0–36.0)
MCHC: 31.7 g/dL (ref 30.0–36.0)
MCHC: 28.4 g/dL — ABNORMAL LOW (ref 30.0–36.0)
MCHC: 31.3 g/dL (ref 30.0–36.0)
MCV: 63.9 fL — ABNORMAL LOW (ref 80.0–100.0)
MCV: 69 fL — ABNORMAL LOW (ref 80.0–100.0)
MCV: 67.5 fL — ABNORMAL LOW (ref 80.0–100.0)
MCV: 67.9 fL — ABNORMAL LOW (ref 80.0–100.0)
Platelets: 294 10*3/uL (ref 150–400)
Platelets: 325 10*3/uL (ref 150–400)
Platelets: 315 10*3/uL (ref 150–400)
Platelets: 301 10*3/uL (ref 150–400)
RBC: 3.27 MIL/uL — ABNORMAL LOW (ref 3.87–5.11)
RBC: 3.65 MIL/uL — ABNORMAL LOW (ref 3.87–5.11)
RBC: 4.49 MIL/uL (ref 3.87–5.11)
RBC: 4.24 MIL/uL (ref 3.87–5.11)
RDW: 25.6 % — ABNORMAL HIGH (ref 11.5–15.5)
RDW: 29.2 % — ABNORMAL HIGH (ref 11.5–15.5)
RDW: 28.3 % — ABNORMAL HIGH (ref 11.5–15.5)
RDW: 29.2 % — ABNORMAL HIGH (ref 11.5–15.5)
WBC: 26.9 10*3/uL — ABNORMAL HIGH (ref 4.0–10.5)
WBC: 24.6 10*3/uL — ABNORMAL HIGH (ref 4.0–10.5)
WBC: 28 10*3/uL — ABNORMAL HIGH (ref 4.0–10.5)
WBC: 24 10*3/uL — ABNORMAL HIGH (ref 4.0–10.5)
nRBC: 1.5 % — ABNORMAL HIGH (ref 0.0–0.2)
nRBC: 1.3 % — ABNORMAL HIGH (ref 0.0–0.2)
nRBC: 1.6 % — ABNORMAL HIGH (ref 0.0–0.2)
nRBC: 2 % — ABNORMAL HIGH (ref 0.0–0.2)

## 2020-10-13 LAB — BASIC METABOLIC PANEL
Anion gap: 9 (ref 5–15)
BUN: 7 mg/dL — ABNORMAL LOW (ref 8–23)
CO2: 25 mmol/L (ref 22–32)
Calcium: 8.8 mg/dL — ABNORMAL LOW (ref 8.9–10.3)
Chloride: 101 mmol/L (ref 98–111)
Creatinine, Ser: 1.02 mg/dL — ABNORMAL HIGH (ref 0.44–1.00)
GFR, Estimated: 55 mL/min — ABNORMAL LOW (ref >60)
Glucose, Bld: 97 mg/dL (ref 70–99)
Potassium: 3.8 mmol/L (ref 3.5–5.1)
Sodium: 135 mmol/L (ref 135–145)

## 2020-10-13 LAB — TYPE AND SCREEN
Antibody Screen: POSITIVE
DAT, IgG: NEGATIVE

## 2020-10-13 LAB — LACTATE DEHYDROGENASE: LDH: 976 U/L — ABNORMAL HIGH (ref 98–192)

## 2020-10-13 MED ORDER — FERROUS SULFATE 325 (65 FE) MG PO TABS
325.0000 mg | ORAL_TABLET | Freq: Three times a day (TID) | ORAL | Status: DC
  Administered 2020-10-13 – 2020-10-14 (×4): 325 mg via ORAL
  Filled 2020-10-13 (×4): qty 1

## 2020-10-13 MED ORDER — AMLODIPINE BESYLATE 10 MG PO TABS
10.0000 mg | ORAL_TABLET | Freq: Every day | ORAL | Status: DC
  Administered 2020-10-13 – 2020-10-14 (×2): 10 mg via ORAL
  Filled 2020-10-13 (×2): qty 1

## 2020-10-13 MED ORDER — PANTOPRAZOLE SODIUM 40 MG IV SOLR
40.0000 mg | Freq: Two times a day (BID) | INTRAVENOUS | Status: DC
  Administered 2020-10-13 – 2020-10-14 (×3): 40 mg via INTRAVENOUS
  Filled 2020-10-13 (×3): qty 40

## 2020-10-13 MED ORDER — SODIUM CHLORIDE 0.9% IV SOLUTION
Freq: Once | INTRAVENOUS | Status: DC
Start: 2020-10-13 — End: 2020-10-14

## 2020-10-13 MED ORDER — FUROSEMIDE 10 MG/ML IJ SOLN: 20.0000 mg | Freq: Once | INTRAMUSCULAR | Status: DC

## 2020-10-13 NOTE — Evaluation (Signed)
Physical Therapy Evaluation Patient Details Name: Cindy Keller MRN: [id number] DOB: February 06, 1940 Today's Date: 10/13/2020   History of Present Illness  80 yo female presents to [facility] on 9/9 with ShOB, LE edema, workup for HF. CXR and CT show bilat pleural effusions. PMH includes HLD, HTN, cholecystectomy,  lymphoproliferative disorder, chronic T12 compression fx, recent gastric ulcer and GIB.  Clinical Impression   Pt presents with [facility] strength, mobility, balance, SpO2 during mobility maintaining O2 92% and greater on 2LO2. Pt with some exertional dyspnea and requires cues for rest breaks, but is mostly independent for mobility. PT encouraged frequent rest breaks as needed during ADLs to prevent significant dyspnea on exertion, as well as use of pulse oximeter at home PRN to monitor SpO2. Pt expresses understanding, pt with no further acute or post-acute PT needs.     Follow Up Recommendations No PT follow up    Equipment Recommendations  None recommended by PT    Recommendations for Other Services       Precautions / Restrictions Precautions Precautions: None Restrictions Weight Bearing Restrictions: No      Mobility  Bed Mobility Overal bed mobility: Independent                  Transfers Overall transfer level: Independent                  Ambulation/Gait Ambulation/Gait assistance: Supervision Gait Distance (Feet): 120 Feet (x2 - standing rest break encouraged by PT to recover dyspnea 2/4) Assistive device: None Gait Pattern/deviations: Step-through pattern;WFL(Within Functional Limits) Gait velocity: WFL   General Gait Details: supervision for safety, DOE 2/4 with cuing from PT for rest breaks and breathing technique. Spo2 92-94% when assessed during gait on RA  Stairs            Wheelchair Mobility    Modified Rankin (Stroke Patients Only)       Balance Overall balance assessment: Independent                           High  level balance activites: Direction changes;Turns;Head turns High Level Balance Comments: WFL 180 deg direction change, step over object, fast/slow gait, horizontal and vertical head turns             Pertinent Vitals/Pain Pain Assessment: No/denies pain    Home Living Family/patient expects to be discharged to:: Private residence Living Arrangements: Spouse/significant other Available Help at Discharge: Family Type of Home: House Home Access: Stairs to enter   [employer] of Steps: 1 threshold Home Layout: Laundry or work area in [place]: Cindy Keller - single point;Walker - 2 wheels      Prior Function Level of Independence: Independent               Hand Dominance   Dominant Hand: Right    Extremity/Trunk Assessment   Upper Extremity Assessment Upper Extremity Assessment: Defer to OT evaluation    Lower Extremity Assessment Lower Extremity Assessment: Overall WFL for tasks assessed    Cervical / Trunk Assessment Cervical / Trunk Assessment: Normal  Communication   Communication: No difficulties  Cognition Arousal/Alertness: Awake/alert Behavior During Therapy: WFL for tasks assessed/performed Overall Cognitive Status: Within Functional Limits for tasks assessed  General Comments      Exercises Other Exercises Other Exercises: PT recommended pt assessing self for dyspnea on exertion at home, rest breaks as needed during activity. PT also ecnouraged pt to purchase a pulse oximeter to monitor O2 saturation during mobility at home, explained SpO2 should be >88%   Assessment/Plan    PT Assessment [place] does not need any further PT services  PT Problem List         PT Treatment Interventions      PT Goals (Current goals can be found in the Care Plan section)  Acute Rehab PT Goals Patient Stated Goal: home ASAP PT Goal Formulation: With patient/family Time For Goal  Achievement: 10/13/20 Potential to Achieve Goals: Good    Frequency     Barriers to discharge        Co-evaluation               AM-PAC PT "6 Clicks" Mobility  Outcome Measure Help needed turning from your back to your side while in a flat bed without using bedrails?: None Help needed moving from lying on your back to sitting on the side of a flat bed without using bedrails?: None Help needed moving to and from a bed to a chair (including a wheelchair)?: None Help needed standing up from a chair using your arms (Cindy.g., wheelchair or bedside chair)?: None Help needed to walk in hospital room?: None Help needed climbing 3-5 steps with a railing? : None 6 Click Score: 24    End of Session   Activity Tolerance: Patient tolerated treatment well Patient left: in bed Nurse Communication: Mobility status PT Visit Diagnosis: Other abnormalities of gait and mobility (R26.89)    Time: [id number] PT Time Calculation (min) (ACUTE ONLY): 18 min   Charges:   PT Evaluation $PT Eval Low Complexity: 1 Low        Cindy Keller S, PT DPT Acute Rehabilitation Services Pager [phone]  Office [phone]   Cindy Keller Cindy Keller 10/13/2020, 11:42 AM

## 2020-10-13 NOTE — Progress Notes (Signed)
Pt is functioning independently. No OT needs.    10/13/20 1500  OT Visit Information  Last OT Received On 10/13/20  Assistance Needed +1  History of Present Illness 80 yo female presents to [facility] on 9/9 with ShOB, LE edema, workup for HF. CXR and CT show bilat pleural effusions. PMH includes HLD, HTN, cholecystectomy,  lymphoproliferative disorder, chronic T12 compression fx, recent gastric ulcer and GIB.  Precautions  Precautions None  Home Living  Family/patient expects to be discharged to: Private residence  Living Arrangements Spouse/significant other  Available Help at Discharge Family  Type of [place] to enter  Entrance Stairs-Number of Steps 1 threshold  [place] or work area in basement  [employer] Tub/shower unit;Walk-in Armed forces technical officer - single point;Walker - 2 wheels;Shower seat  Prior Function  Level of Independence Independent  Comments husband does grocery shopping and cooking  Communication  Communication No difficulties  Pain Assessment  Pain Assessment No/denies pain  Cognition  Arousal/Alertness Awake/alert  Behavior During Therapy WFL for tasks assessed/performed  Overall Cognitive Status Within Functional Limits for tasks assessed  Upper Extremity Assessment  Upper Extremity Assessment Overall WFL for tasks assessed  Lower Extremity Assessment  Lower Extremity Assessment Overall WFL for tasks assessed  Cervical / Trunk Assessment  Cervical / Trunk Assessment Normal  ADL  Overall ADL's  Independent  General ADL Comments educated in energy conservation strategies  Vision- History  Ability to See in Adequate Light 0 Adequate  Patient Visual Report No change from baseline  Bed Mobility  Overal bed mobility Modified Independent  Transfers  Overall transfer level Independent  Equipment used None  Balance  Overall balance assessment Independent  OT - End of Session  Activity  Tolerance Patient tolerated treatment well  Patient left in bed;with call bell/phone within reach;with family/visitor present  OT Assessment  OT Recommendation/Assessment Patient does not need any further OT services  OT Visit Diagnosis Other (comment) (decreased activity tolerance)  AM-PAC OT "6 Clicks" Daily Activity Outcome Measure (Version 2)  Help from another person eating meals? 4  Help from another person taking care of personal grooming? 4  Help from another person toileting, which includes using toliet, bedpan, or urinal? 4  Help from another person bathing (including washing, rinsing, drying)? 4  Help from another person to put on and taking off regular upper body clothing? 4  Help from another person to put on and taking off regular lower body clothing? 4  6 Click Score 24  Progressive Mobility  What is the highest level of mobility based on the progressive mobility assessment? Level 6 (Walks independently in room and hall) - Balance while walking in room without assist - Complete  Mobility Ambulated independently in room  OT Recommendation  Follow Up Recommendations No OT follow up  OT Equipment None recommended by OT  Acute Rehab OT Goals  Patient Stated Goal home ASAP  OT Time Calculation  OT Start Time (ACUTE ONLY) 1401  OT Stop Time (ACUTE ONLY) 1420  OT Time Calculation (min) 19 min  OT General Charges  $OT Visit 1 Visit  OT Evaluation  $OT Eval Low Complexity 1 Low  Written Expression  Dominant Hand Right  Nestor Lewandowsky, OTR/L Acute Rehabilitation Services Pager: [phone] Office: [phone]

## 2020-10-13 NOTE — Progress Notes (Signed)
PROGRESS NOTE                                                                                                                                                                                                             Patient Demographics:    Cindy Keller, is a 80 y.o. female, DOB - April 20, 1940, [id number]  Outpatient Primary MD for the patient is Leighton Ruff, MD    LOS - 1  Admit date - 10/12/2020    Chief Complaint  Patient presents with   Shortness of Breath       Brief Narrative (HPI from H&P) - Cindy Keller is a 80 y.o. 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   Subjective:    Cindy Keller today has, No headache, No chest pain, No abdominal pain - No Nausea, No new weakness tingling or numbness, no SOB   Assessment  & Plan :     Acute respiratory failure caused by acute on chronic iron deficiency microcytic anemia and precipitation of acute on chronic diastolic CHF - she has recent diagnosis of gastric ulcer which was found on a EGD done at [facility] few months ago, no frank melena or frank blood in stool, this could be chronic Hemoccult blood loss causing iron deficiency anemia, her anemia is microcytic, anemia work-up suggests iron deficiency.  She has already received 1 unit of packed RBC upon admission and current H&H appears stable, no signs of brisk ongoing GI bleed, continue IV PPI, she has been placed on oral iron and folic acid supplementation, will continue to monitor closely, obtain echocardiogram.  If stable outpatient GI  work-up and follow-up.   2.  Acute on chronic diastolic CHF precipitated by anemia.  Echo pending, she has been given 2 doses of Lasix with much improvement along with 1 unit of packed RBC transfusion.  Continue beta-blocker and monitor.   3.  Essential hypertension.  Currently on Coreg we will add Norvasc for better control.  For now hold ARB.  4.  HX of lymphoproliferative disorder.  Follows with Dr. Marin Olp.  Continue outpatient follow-up.  Has chronic leukocytosis will monitor.  5.  Elevated D-dimer.  Could be due to underlying lymphoproliferative disorder, will check leg ultrasound and monitor.  6.  History of gastric ulcer.  On IV PPI, post discharge if stable follow-up with [facility].      Condition - Extremely Guarded  Family Communication  :  message left for husband [phone] on 10/13/20 at 67 am  Code Status :  Full  Consults  :  None  PUD Prophylaxis : PPI   Procedures  :     TTE  Leg [place]  CT - 1. Moderate emphysematous changes and areas of pulmonary scarring. 2. Bilateral pleural effusions, moderate on the right and small on the left with overlying atelectasis. 3. Borderline enlarged mediastinal and hilar lymph nodes, likely reactive. 4. Three-vessel coronary artery calcifications. 5. Severe remote compression fracture of T12 with vertebral plana appearance. Aortic Atherosclerosis (ICD10-I70.0) and Emphysema.      Disposition Plan  :    Status is: Inpatient  Remains inpatient appropriate because:IV treatments appropriate due to intensity of illness or inability to take PO  Dispo: The patient is from: Home              Anticipated d/c is to: Home              Patient currently is not medically stable to d/c.   Difficult to place patient No   DVT Prophylaxis  :    Foot Pump / plexipulse Start: 10/12/20 1815     Lab Results  Component Value Date   PLT 325 10/13/2020    Diet :  Diet Order             Diet clear liquid Room service  appropriate? Yes; Fluid consistency: Thin  Diet effective now                    Inpatient Medications  Scheduled Meds:  sodium chloride   Intravenous Once   vitamin C  500 mg Oral Daily   calcium-vitamin D  1 tablet Oral Q breakfast   cholecalciferol  1,000 Units Oral Daily   ferrous sulfate  325 mg Oral TID WC   folic acid  2 mg Oral Daily   furosemide  40 mg Intravenous Daily   pantoprazole (PROTONIX) IV  40 mg Intravenous Q12H   rosuvastatin  5 mg Oral Daily   Continuous Infusions: PRN Meds:.acetaminophen, carvedilol, diazepam, ondansetron (ZOFRAN) IV  Antibiotics  :    Anti-infectives (From admission, onward)    Start     Dose/Rate Route Frequency Ordered Stop   10/13/20 1300  vancomycin (VANCOREADY) IVPB 750 mg/150 mL  Status:  Discontinued        750 mg 150 mL/hr over 60 Minutes Intravenous Every 24 hours 10/12/20 1208 10/12/20 1757   10/12/20 2300  ceFEPIme (MAXIPIME) 2 g in sodium chloride 0.9 % 100 mL IVPB  Status:  Discontinued        2 g 200 mL/hr over 30 Minutes Intravenous Every 12 hours 10/12/20 1208 10/12/20 1757   10/12/20 1130  ceFEPIme (MAXIPIME) 2 g in sodium chloride 0.9 % 100 mL IVPB        2 g 200 mL/hr over 30 Minutes Intravenous  Once 10/12/20 1127 10/12/20 1221   10/12/20 1130  metroNIDAZOLE (FLAGYL) IVPB 500 mg        500  mg 100 mL/hr over 60 Minutes Intravenous  Once 10/12/20 1127 10/12/20 1314   10/12/20 1130  vancomycin (VANCOCIN) IVPB 1000 mg/200 mL premix        1,000 mg 200 mL/hr over 60 Minutes Intravenous  Once 10/12/20 1127 10/12/20 1457        Time Spent in minutes  30   Lala Lund M.D on 10/13/2020 at 11:01 AM  To page go to [email or web address]   [facility] -  Office  [phone]   See all Orders from today for further details    Objective:   Vitals:   10/13/20 0333 10/13/20 0408 10/13/20 0653 10/13/20 0724  BP: (!) 134/55 (!) 141/61 (!) 149/61 (!) 161/85  Pulse: 82 85 87 91  Resp: '18 18 20 20  '$ Temp: 98.4  F (36.9 C) 98.6 F (37 C) 98.4 F (36.9 C) 98.8 F (37.1 C)  TempSrc: Oral Oral Oral Oral  SpO2: 94% 95% 96% 98%  Weight:      Height:        Wt Readings from Last 3 Encounters:  10/13/20 51.8 kg  09/02/19 62.6 kg  06/03/19 63.5 kg     Intake/Output Summary (Last 24 hours) at 10/13/2020 1101 Last data filed at 10/13/2020 0836 Gross per 24 hour  Intake 1662.44 ml  Output 1500 ml  Net 162.44 ml     Physical Exam  Awake Alert, No new F.N deficits, Normal affect [place].AT,PERRAL Supple Neck,No JVD, No cervical lymphadenopathy appriciated.  Symmetrical Chest wall movement, Good air movement bilaterally, CTAB RRR,No Gallops,Rubs or new Murmurs, No Parasternal Heave +ve B.Sounds, Abd Soft, No tenderness, No organomegaly appriciated, No rebound - guarding or rigidity. No Cyanosis, Clubbing or edema, No new Rash or bruise      Data Review:    CBC Recent Labs  Lab 10/12/20 1015 10/13/20 0128 10/13/20 0828 10/13/20 0832  WBC 27.3* 26.9*  --  24.6*  HGB 7.2* 6.3*  --  8.0*  HCT 25.2* 20.9*  --  25.2*  PLT 336 294 320 325  MCV 63.6* 63.9*  --  69.0*  MCH 18.2* 19.3*  --  21.9*  MCHC 28.6* 30.1  --  31.7  RDW 25.3* 25.6*  --  29.2*  LYMPHSABS 2.9  --   --   --   MONOABS 2.5*  --   --   --   EOSABS 0.0  --   --   --   BASOSABS 0.0  --   --   --     Recent Labs  Lab 10/12/20 1015 10/12/20 1135 10/12/20 1524 10/12/20 1816 10/13/20 0128 10/13/20 0828  NA 134*  --   --   --  135  --   K 3.4*  --   --   --  3.8  --   CL 100  --   --   --  101  --   CO2 23  --   --   --  25  --   GLUCOSE 116*  --   --   --  97  --   BUN 8  --   --   --  7*  --   CREATININE 0.85  --   --   --  1.02*  --   CALCIUM 9.0  --   --   --  8.8*  --   AST 22  --   --   --   --   --   ALT 9  --   --   --   --   --  ALKPHOS 97  --   --   --   --   --   BILITOT 1.0  --   --   --   --   --   ALBUMIN 4.0  --   --   --   --   --   DDIMER  --   --   --   --   --  2.53*  LATICACIDVEN  --  1.0  1.5  --   --   --   INR  --   --   --   --   --  1.4*  TSH  --   --   --  2.002  --   --   BNP 247.9*  --   --   --   --   --     ------------------------------------------------------------------------------------------------------------------ No results for input(s): CHOL, HDL, LDLCALC, TRIG, CHOLHDL, LDLDIRECT in the last 72 hours.  No results found for: HGBA1C ------------------------------------------------------------------------------------------------------------------ Recent Labs    10/12/20 1816  TSH 2.002    Cardiac Enzymes No results for input(s): CKMB, TROPONINI, MYOGLOBIN in the last 168 hours.  Invalid input(s): CK ------------------------------------------------------------------------------------------------------------------    Component Value Date/Time   BNP 247.9 (H) 10/12/2020 1015     Radiology Reports DG Chest 2 View  Result Date: 10/12/2020 CLINICAL DATA:  Shortness of breath EXAM: CHEST - 2 VIEW COMPARISON:  Chest radiograph 12/06/2014, CT abdomen/pelvis 02/15/2018, CT chest 10/22/2016 FINDINGS: The heart is mildly enlarged. The mediastinal contours are within normal limits. The pulmonary vasculature is prominent. There are increased interstitial markings throughout both lungs. There are small bilateral pleural effusions with adjacent atelectasis. There is no other focal airspace disease. There is no pneumothorax. There is complete collapse of the T12 vertebral body, new since 2020. IMPRESSION: 1. Findings suspicious for heart failure with mild cardiomegaly, enlarged pulmonary vasculature, mild pulmonary interstitial edema, and small bilateral pleural effusions. 2. Complete collapse of the T12 vertebral body, new since 2020. Electronically Signed   By: Valetta Mole M.D.   On: 10/12/2020 11:25   CT Chest Wo Contrast  Result Date: 10/12/2020 CLINICAL DATA:  Shortness of breath, pneumonia pleural effusions. EXAM: CT CHEST WITHOUT CONTRAST TECHNIQUE:  Multidetector CT imaging of the chest was performed following the standard protocol without IV contrast. COMPARISON:  Chest x-ray same date. FINDINGS: Cardiovascular: The heart is within normal limits in size for age. No pericardial effusion. There is moderate tortuosity, ectasia and calcification of the thoracic aorta but no focal aneurysm. Three-vessel coronary artery calcifications are noted. Mediastinum/Nodes: Borderline enlarged mediastinal and hilar lymph nodes, likely reactive. The esophagus is grossly normal. Lungs/Pleura: Moderate emphysematous changes and areas of pulmonary scarring. There are bilateral pleural effusions, moderate on the right and small on the left. Some overlying atelectasis. No definite infiltrates. No worrisome pulmonary lesions. Upper Abdomen: No significant upper abdominal findings. Advanced atherosclerotic calcifications involving upper abdominal aorta and branch vessels. Musculoskeletal: No breast masses, supraclavicular or axillary adenopathy. The bony thorax is intact. Severe remote compression fracture of T12 with vertebral plana appearance. IMPRESSION: 1. Moderate emphysematous changes and areas of pulmonary scarring. 2. Bilateral pleural effusions, moderate on the right and small on the left with overlying atelectasis. 3. Borderline enlarged mediastinal and hilar lymph nodes, likely reactive. 4. Three-vessel coronary artery calcifications. 5. Severe remote compression fracture of T12 with vertebral plana appearance. Aortic Atherosclerosis (ICD10-I70.0) and Emphysema (ICD10-J43.9). Electronically Signed   By: Marijo Sanes M.D.   On: 10/12/2020 12:58   DG  Chest Port 1 View  Result Date: 10/13/2020 CLINICAL DATA:  80 year old female with shortness of breath since yesterday. EXAM: PORTABLE CHEST 1 VIEW COMPARISON:  Chest CT yesterday 10/12/2020. FINDINGS: Portable AP semi upright view at 0753 hours. Layering bilateral pleural effusions were better demonstrated by CT. Mildly  improved lung volumes. Stable cardiac size and mediastinal contours. Visualized tracheal air column is within normal limits. No pneumothorax. Pulmonary vascularity appears decreased from radiographs yesterday. No consolidation. Stable visualized osseous structures. Severe T12 compression fracture again noted. IMPRESSION: Regression of pulmonary vascular congestion/interstitial edema and improved lung volumes. Layering pleural effusions, better demonstrated by CT yesterday. Electronically Signed   By: Genevie Ann M.D.   On: 10/13/2020 08:20

## 2020-10-13 NOTE — Progress Notes (Signed)
Bilateral lower extremity venous duplex has been completed. Preliminary results can be found in CV Proc through chart review.   10/13/20 12:38 PM Cindy Keller RVT

## 2020-10-13 NOTE — Progress Notes (Signed)
Blood bank, lab, and phlebotomy called. Lab redrawn.

## 2020-10-14 LAB — COMPREHENSIVE METABOLIC PANEL
ALT: 9 U/L (ref 0–44)
AST: 22 U/L (ref 15–41)
Albumin: 3.3 g/dL — ABNORMAL LOW (ref 3.5–5.0)
Alkaline Phosphatase: 68 U/L (ref 38–126)
Anion gap: 9 (ref 5–15)
BUN: 6 mg/dL — ABNORMAL LOW (ref 8–23)
CO2: 25 mmol/L (ref 22–32)
Calcium: 9 mg/dL (ref 8.9–10.3)
Chloride: 102 mmol/L (ref 98–111)
Creatinine, Ser: 1.13 mg/dL — ABNORMAL HIGH (ref 0.44–1.00)
GFR, Estimated: 49 mL/min — ABNORMAL LOW (ref >60)
Glucose, Bld: 95 mg/dL (ref 70–99)
Potassium: 3.1 mmol/L — ABNORMAL LOW (ref 3.5–5.1)
Sodium: 136 mmol/L (ref 135–145)
Total Bilirubin: 1 mg/dL (ref 0.3–1.2)
Total Protein: 5.8 g/dL — ABNORMAL LOW (ref 6.5–8.1)

## 2020-10-14 LAB — CBC WITH DIFFERENTIAL/PLATELET
Abs Immature Granulocytes: 0.94 10*3/uL — ABNORMAL HIGH (ref 0.00–0.07)
Abs Immature Granulocytes: 0.96 10*3/uL — ABNORMAL HIGH (ref 0.00–0.07)
Basophils Absolute: 0 10*3/uL (ref 0.0–0.1)
Basophils Absolute: 0.1 10*3/uL (ref 0.0–0.1)
Basophils Relative: 0 %
Basophils Relative: 1 %
Eosinophils Absolute: 0.1 10*3/uL (ref 0.0–0.5)
Eosinophils Absolute: 0.1 10*3/uL (ref 0.0–0.5)
Eosinophils Relative: 0 %
Eosinophils Relative: 0 %
HCT: 24.9 % — ABNORMAL LOW (ref 36.0–46.0)
HCT: 33.1 % — ABNORMAL LOW (ref 36.0–46.0)
Hemoglobin: 7.5 g/dL — ABNORMAL LOW (ref 12.0–15.0)
Hemoglobin: 9.1 g/dL — ABNORMAL LOW (ref 12.0–15.0)
Immature Granulocytes: 4 %
Immature Granulocytes: 4 %
Lymphocytes Relative: 14 %
Lymphocytes Relative: 12 %
Lymphs Abs: 3.2 10*3/uL (ref 0.7–4.0)
Lymphs Abs: 2.8 10*3/uL (ref 0.7–4.0)
MCH: 20.5 pg — ABNORMAL LOW (ref 26.0–34.0)
MCH: 19.2 pg — ABNORMAL LOW (ref 26.0–34.0)
MCHC: 30.1 g/dL (ref 30.0–36.0)
MCHC: 27.5 g/dL — ABNORMAL LOW (ref 30.0–36.0)
MCV: 68 fL — ABNORMAL LOW (ref 80.0–100.0)
MCV: 69.7 fL — ABNORMAL LOW (ref 80.0–100.0)
Monocytes Absolute: 2.4 10*3/uL — ABNORMAL HIGH (ref 0.1–1.0)
Monocytes Absolute: 1.9 10*3/uL — ABNORMAL HIGH (ref 0.1–1.0)
Monocytes Relative: 10 %
Monocytes Relative: 8 %
Neutro Abs: 17.1 10*3/uL — ABNORMAL HIGH (ref 1.7–7.7)
Neutro Abs: 17.9 10*3/uL — ABNORMAL HIGH (ref 1.7–7.7)
Neutrophils Relative %: 72 %
Neutrophils Relative %: 75 %
Platelets: 298 10*3/uL (ref 150–400)
Platelets: 257 10*3/uL (ref 150–400)
RBC: 3.66 MIL/uL — ABNORMAL LOW (ref 3.87–5.11)
RBC: 4.75 MIL/uL (ref 3.87–5.11)
RDW: 28.6 % — ABNORMAL HIGH (ref 11.5–15.5)
RDW: 29.3 % — ABNORMAL HIGH (ref 11.5–15.5)
WBC: 23.8 10*3/uL — ABNORMAL HIGH (ref 4.0–10.5)
WBC: 23.7 10*3/uL — ABNORMAL HIGH (ref 4.0–10.5)
nRBC: 1.5 % — ABNORMAL HIGH (ref 0.0–0.2)
nRBC: 2.4 % — ABNORMAL HIGH (ref 0.0–0.2)

## 2020-10-14 LAB — MAGNESIUM: Magnesium: 1.7 mg/dL (ref 1.7–2.4)

## 2020-10-14 LAB — ECHOCARDIOGRAM COMPLETE
Height: 63 in
S' Lateral: 2.4 cm
Single Plane A4C EF: 54.9 %
Weight: 1825.6 oz

## 2020-10-14 LAB — VAS US LOWER EXTREMITY VENOUS (DVT): Single Plane A4C EF: 54.9 %

## 2020-10-14 LAB — BRAIN NATRIURETIC PEPTIDE: B Natriuretic Peptide: 164.7 pg/mL — ABNORMAL HIGH (ref 0.0–100.0)

## 2020-10-14 LAB — HAPTOGLOBIN: Haptoglobin: 123 mg/dL (ref 41–333)

## 2020-10-14 MED ORDER — FERROUS SULFATE 325 (65 FE) MG PO TABS: 325.0000 mg | ORAL_TABLET | Freq: Two times a day (BID) | ORAL | 0 refills | Status: AC

## 2020-10-14 MED ORDER — DOCUSATE SODIUM 100 MG PO CAPS: 100.0000 mg | ORAL_CAPSULE | Freq: Two times a day (BID) | ORAL | 0 refills | Status: AC

## 2020-10-14 MED ORDER — ENSURE ENLIVE PO LIQD
237.0000 mL | Freq: Two times a day (BID) | ORAL | Status: DC
  Administered 2020-10-14: 237 mL via ORAL

## 2020-10-14 MED ORDER — POTASSIUM CHLORIDE 20 MEQ PO PACK
40.0000 meq | PACK | Freq: Once | ORAL | Status: AC
  Administered 2020-10-14: 40 meq via ORAL
  Filled 2020-10-14: qty 2

## 2020-10-14 MED ORDER — LOSARTAN POTASSIUM 100 MG PO TABS: 100.0000 mg | ORAL_TABLET | Freq: Every day | ORAL | Status: AC

## 2020-10-14 NOTE — Discharge Instructions (Addendum)
Follow with Primary MD Leighton Ruff, MD and your Gastroenterologist in 7 days, please get an outpatient echocardiogram done by a family physician in the next 7 to 10 days.  Get CBC, CMP, Anaemia panel -  checked next visit within 1 week by Primary MD    Activity: As tolerated with Full fall precautions use walker/cane & assistance as needed  Disposition Home    Diet: Heart Healthy    Special Instructions: If you have smoked or chewed Tobacco  in the last 2 yrs please stop smoking, stop any regular Alcohol  and or any Recreational drug use.  On your next visit with your primary care physician please Get Medicines reviewed and adjusted.  Please request your Prim.MD to go over all Hospital Tests and Procedure/Radiological results at the follow up, please get all Hospital records sent to your Prim MD by signing hospital release before you go home.  If you experience worsening of your admission symptoms, develop shortness of breath, life threatening emergency, suicidal or homicidal thoughts you must seek medical attention immediately by calling 911 or calling your MD immediately  if symptoms less severe.  You Must read complete instructions/literature along with all the possible adverse reactions/side effects for all the Medicines you take and that have been prescribed to you. Take any new Medicines after you have completely understood and accpet all the possible adverse reactions/side effects.

## 2020-10-14 NOTE — Discharge Summary (Signed)
Cindy Keller [id number] DOB: 1941-01-19 DOA: 10/12/2020  PCP: Leighton Ruff, MD  Admit date: 10/12/2020  Discharge date: 10/14/2020  Admitted From: Home  Disposition:  Home   Recommendations for Outpatient Follow-up:   Follow up with PCP in 1-2 weeks  PCP Please obtain BMP/CBC, 2 view CXR in 1week,  (see Discharge instructions)   PCP Please follow up on the following pending results: Check CBC, CMP, iron, ferritin, B12, TIBC in 7 to 10 days.  Needs close outpatient follow-up with GI and hematology.  Please review echocardiogram results which are pending, if the result is not obtainable obtain outpatient echocardiogram.   Home Health: None Equipment/Devices: None  Consultations: None  Discharge Condition: Stable    CODE STATUS: Full    Diet Recommendation: Heart Healthy   Chief Complaint  Patient presents with   Shortness of Breath     Brief history of present illness from the day of admission and additional interim summary    Cindy Keller is a 80 y.o. 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                                                                 Hospital Course   Acute respiratory failure caused by acute on chronic iron deficiency microcytic anemia and precipitation of acute on chronic diastolic CHF - she has recent diagnosis of gastric ulcer which was found on a EGD done at [facility] few months ago, no frank melena or  frank blood in stool, this could be chronic Hemoccult blood loss causing iron deficiency anemia, her anemia is microcytic, anemia work-up suggests iron deficiency.  She received 1 unit of packed RBC on 10/13/2020 after which her H&H is stable, she also received oral iron supplementation which will be continued upon discharge, she is completely symptom-free and actually feels better than she has felt in the last several weeks, will be discharged home on oral PPI and iron supplementation along with home folic acid, request PCP to monitor her CBC along with anemia panel closely.    2.  Acute on chronic diastolic CHF precipitated by anemia.  Possible element, she received few doses of Lasix in the ER however completely euvolemic at this time no Rales, be discharged home, symptoms likely due to symptomatic anemia, request PCP to follow echo results which are still pending.   3.  Essential hypertension.  Continue home regimen.  4.  HX of lymphoproliferative disorder.  Follows with Dr. Marin Olp.  Continue outpatient follow-up.  Has chronic leukocytosis will monitor.   5.  Elevated D-dimer.  Due to underlying lymphoproliferative disorder, leg ultrasound negative, no schistocytes.  No chest pain palpitations or shortness of breath at this time.   6.  History of gastric ulcer.  On PPI, post discharge if stable follow-up with [place] or locally [place].  Discharge diagnosis     Active Problems:   CHF exacerbation ([place])   Acute heart failure [facility])    Discharge instructions    Discharge Instructions     Diet - low sodium heart healthy   Complete by: As directed    Discharge instructions   Complete by: As directed    Follow with Primary MD Leighton Ruff, MD and your Gastroenterologist in 7 days, please get an outpatient echocardiogram done by a family physician in the next 7 to 10 days.   Get CBC, CMP, Anaemia panel -  checked next visit within 1 week by Primary MD     Activity: As tolerated with Full fall precautions use walker/cane & assistance as needed  Disposition Home    Diet: Heart Healthy    Special Instructions: If you have smoked or chewed Tobacco  in the last 2 yrs please stop smoking, stop any regular Alcohol  and or any Recreational drug use.  On your next visit with your primary care physician please Get Medicines reviewed and adjusted.  Please request your Prim.MD to go over all Hospital Tests and Procedure/Radiological results at the follow up, please get all Hospital records sent to your Prim MD by signing hospital release before you go home.  If you experience worsening of your admission symptoms, develop shortness of breath, life threatening emergency, suicidal or homicidal thoughts you must seek medical attention immediately by calling 911 or calling your MD immediately  if symptoms less severe.  You Must read complete instructions/literature along with all the possible adverse reactions/side effects for all the Medicines you take and that have been prescribed to you. Take any new Medicines after you have completely understood and accpet all the possible adverse reactions/side effects.   Increase activity slowly   Complete by: As directed        Discharge Medications   Allergies as of 10/14/2020       Reactions   Benadryl [diphenhydramine Hcl] Other (See Comments)   restless   Codeine Other (See Comments)   restless jittery   Ciprofloxacin Rash, Hives        Medication List     STOP taking these medications    diazepam 5 MG tablet Commonly known as: VALIUM   Fish Oil 1000 MG Caps       TAKE these medications    aspirin EC 81 MG tablet Take 81 mg by mouth daily.   Calcium Carbonate-Vit D-Min 1200-1000 MG-UNIT Chew Chew 1 capsule by mouth daily.   docusate sodium 100 MG capsule Commonly known as: Colace Take 1 capsule (100 mg total) by mouth 2 (two) times daily.   ferrous sulfate 325 (65 FE) MG  tablet Take 1 tablet (325 mg total) by mouth 2 (two) times daily with a meal.   folic acid 1 MG tablet Commonly known as: FOLVITE TAKE 2 TABLETS BY MOUTH EVERY DAY What changed: how much to take   losartan 100 MG tablet Commonly known as: COZAAR Take 1 tablet (100 mg total) by mouth daily. Start taking on: October 15, 2020   pantoprazole 40  MG tablet Commonly known as: PROTONIX Take 40 mg by mouth 2 (two) times daily.   rosuvastatin 5 MG tablet Commonly known as: CRESTOR Take 5 mg by mouth daily.   tetrahydrozoline 0.05 % ophthalmic solution Place 1 drop into both eyes daily.   vitamin C 500 MG tablet Commonly known as: ASCORBIC ACID Take 500 mg by mouth daily.   Vitamin D 1000 units capsule Take 1,000 Units by mouth daily.         Follow-up Information     Leighton Ruff, MD. Schedule an appointment as soon as possible for a visit in 1 week(s).   Specialty: Family Medicine Why: And your gastroenterologist of choice Contact information: [place] [address] [phone]         Ronnette Juniper, MD. Schedule an appointment as soon as possible for a visit in 1 week(s).   Specialty: Gastroenterology Contact information: [place] [address] [phone]         Volanda Napoleon, MD. Schedule an appointment as soon as possible for a visit in 1 week(s).   Specialty: Oncology Contact information: [address] [place] [address] [phone]                 Major procedures and Radiology Reports - PLEASE review detailed and final reports thoroughly  -       DG Chest 2 View  Result Date: 10/12/2020 CLINICAL DATA:  Shortness of breath EXAM: CHEST - 2 VIEW COMPARISON:  Chest radiograph 12/06/2014, CT abdomen/pelvis 02/15/2018, CT chest 10/22/2016 FINDINGS: The heart is mildly enlarged. The mediastinal contours are within normal limits. The pulmonary vasculature is prominent. There  are increased interstitial markings throughout both lungs. There are small bilateral pleural effusions with adjacent atelectasis. There is no other focal airspace disease. There is no pneumothorax. There is complete collapse of the T12 vertebral body, new since 2020. IMPRESSION: 1. Findings suspicious for heart failure with mild cardiomegaly, enlarged pulmonary vasculature, mild pulmonary interstitial edema, and small bilateral pleural effusions. 2. Complete collapse of the T12 vertebral body, new since 2020. Electronically Signed   By: Valetta Mole M.D.   On: 10/12/2020 11:25   CT Chest Wo Contrast  Result Date: 10/12/2020 CLINICAL DATA:  Shortness of breath, pneumonia pleural effusions. EXAM: CT CHEST WITHOUT CONTRAST TECHNIQUE: Multidetector CT imaging of the chest was performed following the standard protocol without IV contrast. COMPARISON:  Chest x-ray same date. FINDINGS: Cardiovascular: The heart is within normal limits in size for age. No pericardial effusion. There is moderate tortuosity, ectasia and calcification of the thoracic aorta but no focal aneurysm. Three-vessel coronary artery calcifications are noted. Mediastinum/Nodes: Borderline enlarged mediastinal and hilar lymph nodes, likely reactive. The esophagus is grossly normal. Lungs/Pleura: Moderate emphysematous changes and areas of pulmonary scarring. There are bilateral pleural effusions, moderate on the right and small on the left. Some overlying atelectasis. No definite infiltrates. No worrisome pulmonary lesions. Upper Abdomen: No significant upper abdominal findings. Advanced atherosclerotic calcifications involving upper abdominal aorta and branch vessels. Musculoskeletal: No breast masses, supraclavicular or axillary adenopathy. The bony thorax is intact. Severe remote compression fracture of T12 with vertebral plana appearance. IMPRESSION: 1. Moderate emphysematous changes and areas of pulmonary scarring. 2. Bilateral pleural effusions,  moderate on the right and small on the left with overlying atelectasis. 3. Borderline enlarged mediastinal and hilar lymph nodes, likely reactive. 4. Three-vessel coronary artery calcifications. 5. Severe remote compression fracture of  T12 with vertebral plana appearance. Aortic Atherosclerosis (ICD10-I70.0) and Emphysema (ICD10-J43.9). Electronically Signed   By: Marijo Sanes M.D.   On: 10/12/2020 12:58   DG Chest Port 1 View  Result Date: 10/13/2020 CLINICAL DATA:  80 year old female with shortness of breath since yesterday. EXAM: PORTABLE CHEST 1 VIEW COMPARISON:  Chest CT yesterday 10/12/2020. FINDINGS: Portable AP semi upright view at 0753 hours. Layering bilateral pleural effusions were better demonstrated by CT. Mildly improved lung volumes. Stable cardiac size and mediastinal contours. Visualized tracheal air column is within normal limits. No pneumothorax. Pulmonary vascularity appears decreased from radiographs yesterday. No consolidation. Stable visualized osseous structures. Severe T12 compression fracture again noted. IMPRESSION: Regression of pulmonary vascular congestion/interstitial edema and improved lung volumes. Layering pleural effusions, better demonstrated by CT yesterday. Electronically Signed   By: Genevie Ann M.D.   On: 10/13/2020 08:20   ECHOCARDIOGRAM COMPLETE  Result Date: 10/14/2020  Lower Venous DVT Study Patient Name:  Cindy Keller  Date of Exam:   10/13/2020 Medical Rec #: [id number]          Accession #:    [id number] Date of Birth: Apr 10, 1940          Patient Gender: F Patient Age:   69 years Exam Location:   [facility] Procedure:      VAS [place] LOWER EXTREMITY VENOUS (DVT) Referring Phys: Wynetta Fines --------------------------------------------------------------------------------  Indications: Elevated Ddimer.  Risk Factors: None identified. Comparison Study: No prior studies. Performing Technologist: MH  Examination Guidelines: A complete evaluation includes B-mode  imaging, spectral Doppler, color Doppler, and power Doppler as needed of all accessible portions of each vessel. Bilateral testing is considered an integral part of a complete examination. Limited examinations for reoccurring indications may be performed as noted. The reflux portion of the exam is performed with the patient in reverse Trendelenburg.  +---------+---------------+---------+-----------+----------+--------------+ RIGHT    CompressibilityPhasicitySpontaneityPropertiesThrombus Aging +---------+---------------+---------+-----------+----------+--------------+ CFV      Full           Yes      Yes                                 +---------+---------------+---------+-----------+----------+--------------+ SFJ      Full                                                        +---------+---------------+---------+-----------+----------+--------------+ FV Prox  Full                                                        +---------+---------------+---------+-----------+----------+--------------+ FV Mid   Full                                                        +---------+---------------+---------+-----------+----------+--------------+ FV DistalFull                                                        +---------+---------------+---------+-----------+----------+--------------+  PFV      Full                                                        +---------+---------------+---------+-----------+----------+--------------+ POP      Full           Yes      Yes                                 +---------+---------------+---------+-----------+----------+--------------+ PTV      Full                                                        +---------+---------------+---------+-----------+----------+--------------+ PERO     Full                                                        +---------+---------------+---------+-----------+----------+--------------+    +---------+---------------+---------+-----------+----------+--------------+ LEFT     CompressibilityPhasicitySpontaneityPropertiesThrombus Aging +---------+---------------+---------+-----------+----------+--------------+ CFV      Full           Yes      Yes                                 +---------+---------------+---------+-----------+----------+--------------+ SFJ      Full                                                        +---------+---------------+---------+-----------+----------+--------------+ FV Prox  Full                                                        +---------+---------------+---------+-----------+----------+--------------+ FV Mid   Full                                                        +---------+---------------+---------+-----------+----------+--------------+ FV DistalFull                                                        +---------+---------------+---------+-----------+----------+--------------+ PFV      Full                                                        +---------+---------------+---------+-----------+----------+--------------+  POP      Full           Yes      Yes                                 +---------+---------------+---------+-----------+----------+--------------+ PTV      Full                                                        +---------+---------------+---------+-----------+----------+--------------+ PERO     Full                                                        +---------+---------------+---------+-----------+----------+--------------+     Summary: RIGHT: - There is no evidence of deep vein thrombosis in the lower extremity.  - No cystic structure found in the popliteal fossa.  LEFT: - There is no evidence of deep vein thrombosis in the lower extremity.  - No cystic structure found in the popliteal fossa.  *See table(s) above for measurements and observations.    Preliminary     VAS [place] LOWER EXTREMITY VENOUS (DVT)  Result Date: 10/14/2020  Lower Venous DVT Study Patient Name:  Cindy Keller  Date of Exam:   10/13/2020 Medical Rec #: [id number]          Accession #:    [id number] Date of Birth: 09-09-1940          Patient Gender: F Patient Age:   51 years Exam Location:  [facility] Procedure:      VAS [place] LOWER EXTREMITY VENOUS (DVT) Referring Phys: Deno Etienne [facility] --------------------------------------------------------------------------------  Indications: Elevated Ddimer.  Risk Factors: None identified. Comparison Study: No prior studies. Performing Technologist: MH  Examination Guidelines: A complete evaluation includes B-mode imaging, spectral Doppler, color Doppler, and power Doppler as needed of all accessible portions of each vessel. Bilateral testing is considered an integral part of a complete examination. Limited examinations for reoccurring indications may be performed as noted. The reflux portion of the exam is performed with the patient in reverse Trendelenburg.  +---------+---------------+---------+-----------+----------+--------------+ RIGHT    CompressibilityPhasicitySpontaneityPropertiesThrombus Aging +---------+---------------+---------+-----------+----------+--------------+ CFV      Full           Yes      Yes                                 +---------+---------------+---------+-----------+----------+--------------+ SFJ      Full                                                        +---------+---------------+---------+-----------+----------+--------------+ FV Prox  Full                                                        +---------+---------------+---------+-----------+----------+--------------+  FV Mid   Full                                                        +---------+---------------+---------+-----------+----------+--------------+ FV DistalFull                                                         +---------+---------------+---------+-----------+----------+--------------+ PFV      Full                                                        +---------+---------------+---------+-----------+----------+--------------+ POP      Full           Yes      Yes                                 +---------+---------------+---------+-----------+----------+--------------+ PTV      Full                                                        +---------+---------------+---------+-----------+----------+--------------+ PERO     Full                                                        +---------+---------------+---------+-----------+----------+--------------+   +---------+---------------+---------+-----------+----------+--------------+ LEFT     CompressibilityPhasicitySpontaneityPropertiesThrombus Aging +---------+---------------+---------+-----------+----------+--------------+ CFV      Full           Yes      Yes                                 +---------+---------------+---------+-----------+----------+--------------+ SFJ      Full                                                        +---------+---------------+---------+-----------+----------+--------------+ FV Prox  Full                                                        +---------+---------------+---------+-----------+----------+--------------+ FV Mid   Full                                                        +---------+---------------+---------+-----------+----------+--------------+  FV DistalFull                                                        +---------+---------------+---------+-----------+----------+--------------+ PFV      Full                                                        +---------+---------------+---------+-----------+----------+--------------+ POP      Full           Yes      Yes                                  +---------+---------------+---------+-----------+----------+--------------+ PTV      Full                                                        +---------+---------------+---------+-----------+----------+--------------+ PERO     Full                                                        +---------+---------------+---------+-----------+----------+--------------+     Summary: RIGHT: - There is no evidence of deep vein thrombosis in the lower extremity.  - No cystic structure found in the popliteal fossa.  LEFT: - There is no evidence of deep vein thrombosis in the lower extremity.  - No cystic structure found in the popliteal fossa.  *See table(s) above for measurements and observations. Electronically signed by Jamelle Haring on 10/14/2020 at 10:07:17 AM.    Final      Today   Subjective    Cindy Keller today has no headache,no chest abdominal pain,no new weakness tingling or numbness, feels much better wants to go home today.     Objective   Blood pressure (!) 137/54, pulse 75, temperature 98.3 F (36.8 C), temperature source Oral, resp. rate 18, height '5\' 3"'$  (1.6 m), weight 50.5 kg, SpO2 97 %.   Intake/Output Summary (Last 24 hours) at 10/14/2020 1112 Last data filed at 10/14/2020 0903 Gross per 24 hour  Intake 720 ml  Output 1100 ml  Net -380 ml    Exam  Awake Alert, No new F.N deficits, Normal affect [place].AT,PERRAL Supple Neck,No JVD, No cervical lymphadenopathy appriciated.  Symmetrical Chest wall movement, Good air movement bilaterally, CTAB RRR,No Gallops,Rubs or new Murmurs, No Parasternal Heave +ve B.Sounds, Abd Soft, Non tender, No organomegaly appriciated, No rebound -guarding or rigidity. No Cyanosis, Clubbing or edema, No new Rash or bruise   Data Review   CBC w Diff:  Lab Results  Component Value Date   WBC 23.7 (H) 10/14/2020   HGB 9.1 (L) 10/14/2020   HGB 12.9 09/02/2019   HGB 12.9 10/15/2016   HGB 14.2 05/10/2007   HCT 33.1 (L) 10/14/2020    HCT 35.4 10/15/2016  HCT Unable to determine 05/10/2007   PLT 257 10/14/2020   PLT 571 (H) 09/02/2019   PLT 250 10/15/2016   PLT 395 05/10/2007   LYMPHOPCT 12 10/14/2020   LYMPHOPCT 68.5 (H) 10/15/2016   LYMPHOPCT 32.1 05/10/2007   BANDSPCT 3 09/21/2017   MONOPCT 8 10/14/2020   MONOPCT 16.3 (H) 10/15/2016   MONOPCT 8.5 05/10/2007   EOSPCT 0 10/14/2020   EOSPCT 0.2 10/15/2016   EOSPCT 2.5 05/10/2007   BASOPCT 1 10/14/2020   BASOPCT 3.9 (H) 10/15/2016   BASOPCT 0.6 05/10/2007    CMP:  Lab Results  Component Value Date   NA 136 10/14/2020   NA 141 10/15/2016   NA 137 09/17/2016   K 3.1 (L) 10/14/2020   K 4.6 10/15/2016   K 4.5 09/17/2016   CL 102 10/14/2020   CL 105 10/15/2016   CO2 25 10/14/2020   CO2 28 10/15/2016   CO2 26 09/17/2016   BUN 6 (L) 10/14/2020   BUN 8 10/15/2016   BUN 8.6 09/17/2016   CREATININE 1.13 (H) 10/14/2020   CREATININE 1.04 (H) 09/02/2019   CREATININE 1.0 10/15/2016   CREATININE 0.9 09/17/2016   PROT 5.8 (L) 10/14/2020   PROT 7.0 10/15/2016   PROT 7.3 09/17/2016   ALBUMIN 3.3 (L) 10/14/2020   ALBUMIN 4.0 10/15/2016   ALBUMIN 4.3 09/17/2016   BILITOT 1.0 10/14/2020   BILITOT 0.7 09/02/2019   BILITOT 0.87 09/17/2016   ALKPHOS 68 10/14/2020   ALKPHOS 62 10/15/2016   ALKPHOS 71 09/17/2016   AST 22 10/14/2020   AST 23 09/02/2019   AST 22 09/17/2016   ALT 9 10/14/2020   ALT 11 09/02/2019   ALT 21 10/15/2016   ALT 12 09/17/2016  .   Total Time in preparing paper work, data evaluation and todays exam - 26 minutes  Lala Lund M.D on 10/14/2020 at 11:12 AM  [facility]

## 2020-10-16 LAB — TYPE AND SCREEN
ABO/RH(D): B POS
Antibody Screen: NEGATIVE
Unit division: 0
Unit division: 0

## 2020-10-16 LAB — BPAM RBC
Blood Product Expiration Date: 202210132359
Blood Product Expiration Date: 202210132359
ISSUE DATE / TIME: 202209100342
Unit Type and Rh: 5100
Unit Type and Rh: 5100

## 2020-10-17 DIAGNOSIS — H34812 Central retinal vein occlusion, left eye, with macular edema: Secondary | ICD-10-CM | POA: Diagnosis not present

## 2020-10-17 LAB — CULTURE, BLOOD (SINGLE): Culture: NO GROWTH

## 2020-10-18 DIAGNOSIS — D500 Iron deficiency anemia secondary to blood loss (chronic): Secondary | ICD-10-CM | POA: Diagnosis not present

## 2020-10-18 DIAGNOSIS — I10 Essential (primary) hypertension: Secondary | ICD-10-CM | POA: Diagnosis not present

## 2020-10-18 DIAGNOSIS — I509 Heart failure, unspecified: Secondary | ICD-10-CM | POA: Diagnosis not present

## 2020-10-19 ENCOUNTER — Other Ambulatory Visit: Payer: Self-pay | Admitting: Home Modifications

## 2020-10-19 ENCOUNTER — Ambulatory Visit
Admission: RE | Admit: 2020-10-19 | Discharge: 2020-10-19 | Disposition: A | Discharge status: Home / Self Care | Payer: Medicare Other | Source: Ambulatory Visit | Attending: Home Modifications | Admitting: Home Modifications

## 2020-10-19 DIAGNOSIS — I509 Heart failure, unspecified: Secondary | ICD-10-CM

## 2020-11-21 DIAGNOSIS — H34812 Central retinal vein occlusion, left eye, with macular edema: Secondary | ICD-10-CM | POA: Diagnosis not present

## 2020-11-28 DIAGNOSIS — I10 Essential (primary) hypertension: Secondary | ICD-10-CM | POA: Diagnosis not present

## 2020-11-28 DIAGNOSIS — D500 Iron deficiency anemia secondary to blood loss (chronic): Secondary | ICD-10-CM | POA: Diagnosis not present

## 2021-01-02 DIAGNOSIS — H34812 Central retinal vein occlusion, left eye, with macular edema: Secondary | ICD-10-CM | POA: Diagnosis not present

## 2021-01-08 ENCOUNTER — Other Ambulatory Visit: Payer: Self-pay

## 2021-01-08 DIAGNOSIS — D72820 Lymphocytosis (symptomatic): Secondary | ICD-10-CM

## 2021-01-08 DIAGNOSIS — D473 Essential (hemorrhagic) thrombocythemia: Secondary | ICD-10-CM

## 2021-01-09 ENCOUNTER — Inpatient Hospital Stay (HOSPITAL_BASED_OUTPATIENT_CLINIC_OR_DEPARTMENT_OTHER): Payer: Medicare Other | Admitting: Hematology & Oncology

## 2021-01-09 ENCOUNTER — Encounter: Payer: Self-pay | Admitting: Hematology & Oncology

## 2021-01-09 ENCOUNTER — Inpatient Hospital Stay: Payer: Medicare Other | Attending: Hematology & Oncology

## 2021-01-09 ENCOUNTER — Other Ambulatory Visit: Payer: Self-pay

## 2021-01-09 VITALS — BP 117/63 | HR 75 | Temp 98.2°F | Resp 18 | Wt 109.0 lb

## 2021-01-09 DIAGNOSIS — D649 Anemia, unspecified: Secondary | ICD-10-CM | POA: Diagnosis not present

## 2021-01-09 DIAGNOSIS — I5021 Acute systolic (congestive) heart failure: Secondary | ICD-10-CM | POA: Diagnosis not present

## 2021-01-09 DIAGNOSIS — D72820 Lymphocytosis (symptomatic): Secondary | ICD-10-CM

## 2021-01-09 DIAGNOSIS — D473 Essential (hemorrhagic) thrombocythemia: Secondary | ICD-10-CM | POA: Diagnosis not present

## 2021-01-09 LAB — CMP (CANCER CENTER ONLY)
ALT: 7 U/L (ref 0–44)
AST: 27 U/L (ref 15–41)
Albumin: 4.7 g/dL (ref 3.5–5.0)
Alkaline Phosphatase: 77 U/L (ref 38–126)
Anion gap: 9 (ref 5–15)
BUN: 12 mg/dL (ref 8–23)
CO2: 27 mmol/L (ref 22–32)
Calcium: 10.1 mg/dL (ref 8.9–10.3)
Chloride: 103 mmol/L (ref 98–111)
Creatinine: 1.08 mg/dL — ABNORMAL HIGH (ref 0.44–1.00)
GFR, Estimated: 52 mL/min — ABNORMAL LOW (ref >60)
Glucose, Bld: 104 mg/dL — ABNORMAL HIGH (ref 70–99)
Potassium: 4.4 mmol/L (ref 3.5–5.1)
Sodium: 139 mmol/L (ref 135–145)
Total Bilirubin: 0.7 mg/dL (ref 0.3–1.2)
Total Protein: 7.1 g/dL (ref 6.5–8.1)

## 2021-01-09 LAB — LACTATE DEHYDROGENASE: LDH: 1496 U/L — ABNORMAL HIGH (ref 98–192)

## 2021-01-09 LAB — CBC WITH DIFFERENTIAL (CANCER CENTER ONLY)
Abs Immature Granulocytes: 0.55 10*3/uL — ABNORMAL HIGH (ref 0.00–0.07)
Basophils Absolute: 0 10*3/uL (ref 0.0–0.1)
Basophils Relative: 0 %
Eosinophils Absolute: 0 10*3/uL (ref 0.0–0.5)
Eosinophils Relative: 0 %
HCT: 35 % — ABNORMAL LOW (ref 36.0–46.0)
Hemoglobin: 11.4 g/dL — ABNORMAL LOW (ref 12.0–15.0)
Immature Granulocytes: 4 %
Lymphocytes Relative: 16 %
Lymphs Abs: 2 10*3/uL (ref 0.7–4.0)
MCH: 25.3 pg — ABNORMAL LOW (ref 26.0–34.0)
MCHC: 32.6 g/dL (ref 30.0–36.0)
MCV: 77.8 fL — ABNORMAL LOW (ref 80.0–100.0)
Monocytes Absolute: 1.4 10*3/uL — ABNORMAL HIGH (ref 0.1–1.0)
Monocytes Relative: 11 %
Neutro Abs: 8.6 10*3/uL — ABNORMAL HIGH (ref 1.7–7.7)
Neutrophils Relative %: 69 %
Platelet Count: 287 10*3/uL (ref 150–400)
RBC: 4.5 MIL/uL (ref 3.87–5.11)
RDW: 28.1 % — ABNORMAL HIGH (ref 11.5–15.5)
WBC Count: 12.6 10*3/uL — ABNORMAL HIGH (ref 4.0–10.5)
nRBC: 1 % — ABNORMAL HIGH (ref 0.0–0.2)

## 2021-01-09 LAB — RETICULOCYTES
Immature Retic Fract: 26.4 % — ABNORMAL HIGH (ref 2.3–15.9)
RBC.: 4.59 MIL/uL (ref 3.87–5.11)
Retic Count, Absolute: 124.4 10*3/uL (ref 19.0–186.0)
Retic Ct Pct: 2.7 % (ref 0.4–3.1)

## 2021-01-09 LAB — FERRITIN: Ferritin: 93 ng/mL (ref 11–307)

## 2021-01-09 LAB — IRON AND TIBC
Iron: 102 ug/dL (ref 41–142)
Saturation Ratios: 31 % (ref 21–57)
TIBC: 329 ug/dL (ref 236–444)
UIBC: 227 ug/dL (ref 120–384)

## 2021-01-09 LAB — SAVE SMEAR(SSMR), FOR PROVIDER SLIDE REVIEW

## 2021-01-09 NOTE — Progress Notes (Signed)
Hematology and Oncology Follow Up Visit  KAYLYNN CHAMBLIN [id number] November 26, 1940 80 y.o. 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 Maxson is here today for a long awaited follow-up.  We have not seen her since July 2021.  She apparently has had problems with GI bleeding.  Back in July, sound like she had GI bleeding from an ulcer.  She required some transfusions.  She had some bleeding again it sounds like in September.  She had a hemoglobin of 6.8.  She was given a unit of blood.  She was given iron.  I think that single it is interesting that she uses Voltaren cream for her back.  I told her this could certainly cause her ulcers.  I told her that she really needs to be careful using this and that she needs to be taking an acids.  We see her because of the B-cell lymphoproliferative disorder.  This has not been a problem for her.  She does not have a increasing white cell count.  Again, I suspect her anemia, which is much better because she is on oral iron now is from iron deficiency.  She does have back pain from the compression fracture.  This does not bother her still.  Thankfully, she has had no problems with COVID.  She has had no change in bowel or bladder habits.  She has had melena in the past.  Currently, I would say her performance status is probably ECOG 2.     Medications:  Allergies as of 01/09/2021       Reactions   Alendronate Sodium Other (See Comments)   Benadryl [diphenhydramine Hcl] Other (See Comments)   restless   Codeine Other (See Comments)   restless jittery   Diphenhydramine Other (See Comments)   Lisinopril Other (See Comments)   Ciprofloxacin Rash, Hives, Other (See Comments)        Medication List        Accurate as of January 09, 2021  8:10 AM. If you have any questions, ask your nurse or doctor.           aspirin EC 81 MG tablet Take 81 mg by mouth daily.   Calcium Carbonate-Vit D-Min 1200-1000 MG-UNIT Chew Chew 1 capsule by mouth daily.   ferrous sulfate 325 (65 FE) MG tablet Take 1 tablet (325 mg total) by mouth 2 (two) times daily with a meal.   folic acid 1 MG tablet Commonly known as: FOLVITE TAKE 2 TABLETS BY MOUTH EVERY DAY What changed: how much to take   losartan 100 MG tablet Commonly known as: COZAAR Take 1 tablet (100 mg total) by mouth daily.   pantoprazole 40 MG tablet Commonly known as: PROTONIX Take 40 mg by mouth 2 (two) times daily.   rosuvastatin 5 MG tablet Commonly known as: CRESTOR Take 5 mg by mouth daily.   tetrahydrozoline 0.05 % ophthalmic solution Place 1 drop into both eyes daily.   vitamin C 500 MG tablet Commonly known as: ASCORBIC ACID Take 500 mg by mouth daily.   Vitamin D 1000 units capsule Take 1,000 Units by mouth daily.        Allergies:  Allergies  Allergen Reactions   Alendronate Sodium Other (See Comments)   Benadryl [Diphenhydramine Hcl] Other (See Comments)    restless   Codeine Other (See Comments)  restless jittery    Diphenhydramine Other (See Comments)   Lisinopril Other (See Comments)   Ciprofloxacin Rash, Hives and Other (See Comments)    Past Medical History, Surgical history, Social history, and Family History were reviewed and updated.  Review of Systems: Review of Systems  Constitutional: Negative.   Eyes: Negative.   Respiratory: Negative.    Cardiovascular: Negative.   Gastrointestinal: Negative.   Genitourinary: Negative.   Musculoskeletal: Negative.   Skin: Negative.  Negative for rash.  Neurological: Negative.   Endo/Heme/Allergies: Negative.   Psychiatric/Behavioral: Negative.    All other systems reviewed and are negative.   Physical Exam:  weight is 109 lb (49.4 kg). Her oral temperature is 98.2 F (36.8 C). Her blood pressure is 117/63 and her pulse is 75. Her  respiration is 18 and oxygen saturation is 98%.   Wt Readings from Last 3 Encounters:  01/09/21 109 lb (49.4 kg)  10/14/20 111 lb 6.4 oz (50.5 kg)  09/02/19 138 lb (62.6 kg)    Physical Exam Vitals reviewed.  HENT:     Head: Normocephalic and atraumatic.  Eyes:     Pupils: Pupils are equal, round, and reactive to light.  Cardiovascular:     Rate and Rhythm: Normal rate and regular rhythm.     Heart sounds: Normal heart sounds.  Pulmonary:     Effort: Pulmonary effort is normal.     Breath sounds: Normal breath sounds.  Abdominal:     General: Bowel sounds are normal.     Palpations: Abdomen is soft.  Musculoskeletal:        General: No tenderness or deformity. Normal range of motion.     Cervical back: Normal range of motion.  Lymphadenopathy:     Cervical: No cervical adenopathy.  Skin:    General: Skin is warm and dry.     Findings: No erythema or rash.  Neurological:     Mental Status: She is alert and oriented to person, place, and time.  Psychiatric:        Behavior: Behavior normal.        Thought Content: Thought content normal.        Judgment: Judgment normal.    Lab Results  Component Value Date   WBC 12.6 (H) 01/09/2021   HGB 11.4 (L) 01/09/2021   HCT 35.0 (L) 01/09/2021   MCV 77.8 (L) 01/09/2021   PLT 287 01/09/2021   Lab Results  Component Value Date   FERRITIN 198 06/03/2019   IRON 13 (L) 10/12/2020   TIBC 374 10/12/2020   UIBC 361 10/12/2020   IRONPCTSAT 3 (L) 10/12/2020   Lab Results  Component Value Date   RETICCTPCT 2.7 01/09/2021   RBC 4.59 01/09/2021   RETICCTABS 98.5 08/27/2006   Lab Results  Component Value Date   KPAFRELGTCHN 49.6 (H) 02/15/2018   LAMBDASER 13.8 02/15/2018   KAPLAMBRATIO 3.59 (H) 02/15/2018   Lab Results  Component Value Date   IGGSERUM 743 02/15/2018   IGA 162 02/15/2018   IGMSERUM 333 (H) 02/15/2018   Lab Results  Component Value Date   TOTALPROTELP 7.1 12/21/2017   ALBUMINELP 4.3 12/21/2017   A1GS  0.2 12/21/2017   A2GS 0.6 12/21/2017   BETS 1.2 12/21/2017   GAMS 0.7 12/21/2017   MSPIKE Not Observed 12/21/2017     Chemistry      Component Value Date/Time   NA 136 10/14/2020 0206   NA 141 10/15/2016 0954   NA 137 09/17/2016 1020   K  3.1 (L) 10/14/2020 0206   K 4.6 10/15/2016 0954   K 4.5 09/17/2016 1020   CL 102 10/14/2020 0206   CL 105 10/15/2016 0954   CO2 25 10/14/2020 0206   CO2 28 10/15/2016 0954   CO2 26 09/17/2016 1020   BUN 6 (L) 10/14/2020 0206   BUN 8 10/15/2016 0954   BUN 8.6 09/17/2016 1020   CREATININE 1.13 (H) 10/14/2020 0206   CREATININE 1.04 (H) 09/02/2019 0957   CREATININE 1.0 10/15/2016 0954   CREATININE 0.9 09/17/2016 1020      Component Value Date/Time   CALCIUM 9.0 10/14/2020 0206   CALCIUM 9.4 10/15/2016 0954   CALCIUM 9.8 09/17/2016 1020   ALKPHOS 68 10/14/2020 0206   ALKPHOS 62 10/15/2016 0954   ALKPHOS 71 09/17/2016 1020   AST 22 10/14/2020 0206   AST 23 09/02/2019 0957   AST 22 09/17/2016 1020   ALT 9 10/14/2020 0206   ALT 11 09/02/2019 0957   ALT 21 10/15/2016 0954   ALT 12 09/17/2016 1020   BILITOT 1.0 10/14/2020 0206   BILITOT 0.7 09/02/2019 0957   BILITOT 0.87 09/17/2016 1020      Impression and Plan: Ms. 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  She is not symptomatic with this.  Her hemoglobin certainly is improving.  She is on the oral iron.  This is not causing her problems.  As far as a B-cell lymphoproliferative process, she has this.  This is not worsening from my perspective.  She is not thrombocytopenic.  For right now, we will go ahead and plan to get her back to see [place] in 2 months.      Volanda Napoleon, MD 12/7/20228:10 AM

## 2021-02-06 DIAGNOSIS — H34812 Central retinal vein occlusion, left eye, with macular edema: Secondary | ICD-10-CM | POA: Diagnosis not present

## 2021-03-06 ENCOUNTER — Inpatient Hospital Stay: Payer: Medicare Other | Attending: Hematology & Oncology

## 2021-03-06 ENCOUNTER — Telehealth: Payer: Self-pay | Admitting: *Deleted

## 2021-03-06 ENCOUNTER — Inpatient Hospital Stay: Payer: Medicare Other | Admitting: Hematology & Oncology

## 2021-03-06 ENCOUNTER — Other Ambulatory Visit: Payer: Medicare Other

## 2021-03-06 ENCOUNTER — Encounter: Payer: Self-pay | Admitting: Hematology & Oncology

## 2021-03-06 ENCOUNTER — Other Ambulatory Visit: Payer: Self-pay

## 2021-03-06 ENCOUNTER — Ambulatory Visit: Payer: Medicare Other | Admitting: Hematology & Oncology

## 2021-03-06 VITALS — BP 139/62 | HR 68 | Temp 98.0°F | Resp 18 | Wt 108.0 lb

## 2021-03-06 DIAGNOSIS — D473 Essential (hemorrhagic) thrombocythemia: Secondary | ICD-10-CM

## 2021-03-06 DIAGNOSIS — D72820 Lymphocytosis (symptomatic): Secondary | ICD-10-CM | POA: Diagnosis not present

## 2021-03-06 DIAGNOSIS — D682 Hereditary deficiency of other clotting factors: Secondary | ICD-10-CM | POA: Insufficient documentation

## 2021-03-06 DIAGNOSIS — I5021 Acute systolic (congestive) heart failure: Secondary | ICD-10-CM

## 2021-03-06 LAB — RETICULOCYTES
Immature Retic Fract: 28.6 % — ABNORMAL HIGH (ref 2.3–15.9)
RBC.: 4.57 MIL/uL (ref 3.87–5.11)
Retic Count, Absolute: 112 10*3/uL (ref 19.0–186.0)
Retic Ct Pct: 2.5 % (ref 0.4–3.1)

## 2021-03-06 LAB — CMP (CANCER CENTER ONLY)
ALT: 9 U/L (ref 0–44)
AST: 28 U/L (ref 15–41)
Albumin: 4.8 g/dL (ref 3.5–5.0)
Alkaline Phosphatase: 77 U/L (ref 38–126)
Anion gap: 10 (ref 5–15)
BUN: 11 mg/dL (ref 8–23)
CO2: 26 mmol/L (ref 22–32)
Calcium: 10.1 mg/dL (ref 8.9–10.3)
Chloride: 104 mmol/L (ref 98–111)
Creatinine: 0.96 mg/dL (ref 0.44–1.00)
GFR, Estimated: 59 mL/min — ABNORMAL LOW (ref >60)
Glucose, Bld: 102 mg/dL — ABNORMAL HIGH (ref 70–99)
Potassium: 4.2 mmol/L (ref 3.5–5.1)
Sodium: 140 mmol/L (ref 135–145)
Total Bilirubin: 0.8 mg/dL (ref 0.3–1.2)
Total Protein: 7 g/dL (ref 6.5–8.1)

## 2021-03-06 LAB — CBC WITH DIFFERENTIAL (CANCER CENTER ONLY)
Abs Immature Granulocytes: 0.33 10*3/uL — ABNORMAL HIGH (ref 0.00–0.07)
Basophils Absolute: 0 10*3/uL (ref 0.0–0.1)
Basophils Relative: 0 %
Eosinophils Absolute: 0 10*3/uL (ref 0.0–0.5)
Eosinophils Relative: 0 %
HCT: 36.8 % (ref 36.0–46.0)
Hemoglobin: 12 g/dL (ref 12.0–15.0)
Immature Granulocytes: 3 %
Lymphocytes Relative: 15 %
Lymphs Abs: 1.7 10*3/uL (ref 0.7–4.0)
MCH: 26.1 pg (ref 26.0–34.0)
MCHC: 32.6 g/dL (ref 30.0–36.0)
MCV: 80.2 fL (ref 80.0–100.0)
Monocytes Absolute: 1 10*3/uL (ref 0.1–1.0)
Monocytes Relative: 9 %
Neutro Abs: 8.2 10*3/uL — ABNORMAL HIGH (ref 1.7–7.7)
Neutrophils Relative %: 73 %
Platelet Count: 251 10*3/uL (ref 150–400)
RBC: 4.59 MIL/uL (ref 3.87–5.11)
RDW: 21.6 % — ABNORMAL HIGH (ref 11.5–15.5)
Smear Review: NORMAL
WBC Count: 11.3 10*3/uL — ABNORMAL HIGH (ref 4.0–10.5)
nRBC: 0.6 % — ABNORMAL HIGH (ref 0.0–0.2)

## 2021-03-06 LAB — SAVE SMEAR(SSMR), FOR PROVIDER SLIDE REVIEW

## 2021-03-06 LAB — IRON AND IRON BINDING CAPACITY (CC-WL,HP ONLY)
Iron: 114 ug/dL (ref 28–170)
Saturation Ratios: 32 % — ABNORMAL HIGH (ref 10.4–31.8)
TIBC: 358 ug/dL (ref 250–450)
UIBC: 244 ug/dL (ref 148–442)

## 2021-03-06 LAB — FERRITIN: Ferritin: 102 ng/mL (ref 11–307)

## 2021-03-06 NOTE — Telephone Encounter (Signed)
Per 03/06/21 los - gave upcoming appointments - confirmed °

## 2021-03-06 NOTE — Progress Notes (Signed)
Hematology and Oncology Follow Up Visit  Cindy Keller [id number] [birth year] 81 y.o. 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  We last saw her back in December.  She has a ecchymoses over on the right hip area.  She is not sure how this happened.  She does take baby aspirin.  She has had no bleeding otherwise.  She is losing a little bit of weight.  She is a little bit worried about this.  She says she is eating okay.  She is having no problems with nausea or vomiting.  She is having no change in bowel or bladder habits.  She has had no swollen lymph nodes.  There is no leg swelling.  I am just happy that her blood counts are doing a whole lot better.  Her last iron levels back in December showed a ferritin of 93 with an iron saturation of 31%.  Overall, I think her performance status is probably ECOG 2.     Medications:  Allergies as of 03/06/2021       Reactions   Alendronate Sodium Other (See Comments)   Benadryl [diphenhydramine Hcl] Other (See Comments)   restless   Codeine Other (See Comments)   restless jittery   Diphenhydramine Other (See Comments)   Lisinopril Other (See Comments)   Ciprofloxacin Rash, Hives, Other (See Comments)        Medication List        Accurate as of March 06, 2021 11:23 AM. If you have any questions, ask your nurse or doctor.          aspirin EC 81 MG tablet Take 81 mg by mouth daily.   Calcium Carbonate-Vit D-Min 1200-1000 MG-UNIT Chew Chew 1 capsule by mouth daily.   ferrous sulfate 325 (65 FE) MG tablet Take 1 tablet (325 mg total) by mouth 2 (two) times daily with a meal.   folic acid 1 MG tablet Commonly known as: FOLVITE TAKE 2 TABLETS BY MOUTH EVERY DAY What changed:  how much to take when to take this   losartan 100 MG  tablet Commonly known as: COZAAR Take 1 tablet (100 mg total) by mouth daily.   pantoprazole 40 MG tablet Commonly known as: PROTONIX Take 40 mg by mouth 2 (two) times daily.   rosuvastatin 5 MG tablet Commonly known as: CRESTOR Take 5 mg by mouth daily.   tetrahydrozoline 0.05 % ophthalmic solution Place 1 drop into both eyes daily.   vitamin C 500 MG tablet Commonly known as: ASCORBIC ACID Take 500 mg by mouth daily.   Vitamin D 1000 units capsule Take 1,000 Units by mouth daily.        Allergies:  Allergies  Allergen Reactions   Alendronate Sodium Other (See Comments)   Benadryl [Diphenhydramine Hcl] Other (See Comments)    restless   Codeine Other (See Comments)    restless jittery    Diphenhydramine Other (See Comments)   Lisinopril Other (See Comments)   Ciprofloxacin Rash, Hives and Other (See Comments)    Past Medical History, Surgical history, Social history, and Family History were reviewed and updated.  Review of Systems: Review of Systems  Constitutional: Negative.   Eyes: Negative.   Respiratory: Negative.    Cardiovascular: Negative.   Gastrointestinal: Negative.   Genitourinary: Negative.  Musculoskeletal: Negative.   Skin: Negative.  Negative for rash.  Neurological: Negative.   Endo/Heme/Allergies: Negative.   Psychiatric/Behavioral: Negative.    All other systems reviewed and are negative.   Physical Exam:  weight is 108 lb (49 kg). Her oral temperature is 98 F (36.7 C). Her blood pressure is 139/62 and her pulse is 68. Her respiration is 18 and oxygen saturation is 100%.   Wt Readings from Last 3 Encounters:  03/06/21 108 lb (49 kg)  01/09/21 109 lb (49.4 kg)  10/14/20 111 lb 6.4 oz (50.5 kg)    Physical Exam Vitals reviewed.  HENT:     Head: Normocephalic and atraumatic.  Eyes:     Pupils: Pupils are equal, round, and reactive to light.  Cardiovascular:     Rate and Rhythm: Normal rate and regular rhythm.     Heart  sounds: Normal heart sounds.  Pulmonary:     Effort: Pulmonary effort is normal.     Breath sounds: Normal breath sounds.  Abdominal:     General: Bowel sounds are normal.     Palpations: Abdomen is soft.     Comments: On abdominal exam over the right hip area, there is an ecchymoses.  There is a subcutaneous hematoma that we can feel.  Is probably about a centimeter in size.  It is mobile and slightly tender.  There is no palpable splenomegaly.  There is no inguinal adenopathy.  Musculoskeletal:        General: No tenderness or deformity. Normal range of motion.     Cervical back: Normal range of motion.  Lymphadenopathy:     Cervical: No cervical adenopathy.  Skin:    General: Skin is warm and dry.     Findings: No erythema or rash.  Neurological:     Mental Status: She is alert and oriented to person, place, and time.  Psychiatric:        Behavior: Behavior normal.        Thought Content: Thought content normal.        Judgment: Judgment normal.    Lab Results  Component Value Date   WBC 11.3 (H) 03/06/2021   HGB 12.0 03/06/2021   HCT 36.8 03/06/2021   MCV 80.2 03/06/2021   PLT 251 03/06/2021   Lab Results  Component Value Date   FERRITIN 93 01/09/2021   IRON 102 01/09/2021   TIBC 329 01/09/2021   UIBC 227 01/09/2021   IRONPCTSAT 31 01/09/2021   Lab Results  Component Value Date   RETICCTPCT 2.5 03/06/2021   RBC 4.57 03/06/2021   RBC 4.59 03/06/2021   RETICCTABS 98.5 08/27/2006   Lab Results  Component Value Date   KPAFRELGTCHN 49.6 (H) 02/15/2018   LAMBDASER 13.8 02/15/2018   KAPLAMBRATIO 3.59 (H) 02/15/2018   Lab Results  Component Value Date   IGGSERUM 743 02/15/2018   IGA 162 02/15/2018   IGMSERUM 333 (H) 02/15/2018   Lab Results  Component Value Date   TOTALPROTELP 7.1 12/21/2017   ALBUMINELP 4.3 12/21/2017   A1GS 0.2 12/21/2017   A2GS 0.6 12/21/2017   BETS 1.2 12/21/2017   GAMS 0.7 12/21/2017   MSPIKE Not Observed 12/21/2017     Chemistry       Component Value Date/Time   NA 140 03/06/2021 0912   NA 141 10/15/2016 0954   NA 137 09/17/2016 1020   K 4.2 03/06/2021 0912   K 4.6 10/15/2016 0954   K 4.5 09/17/2016 1020   CL 104 03/06/2021  0912   CL 105 10/15/2016 0954   CO2 26 03/06/2021 0912   CO2 28 10/15/2016 0954   CO2 26 09/17/2016 1020   BUN 11 03/06/2021 0912   BUN 8 10/15/2016 0954   BUN 8.6 09/17/2016 1020   CREATININE 0.96 03/06/2021 0912   CREATININE 1.0 10/15/2016 0954   CREATININE 0.9 09/17/2016 1020      Component Value Date/Time   CALCIUM 10.1 03/06/2021 0912   CALCIUM 9.4 10/15/2016 0954   CALCIUM 9.8 09/17/2016 1020   ALKPHOS 77 03/06/2021 0912   ALKPHOS 62 10/15/2016 0954   ALKPHOS 71 09/17/2016 1020   AST 28 03/06/2021 0912   AST 22 09/17/2016 1020   ALT 9 03/06/2021 0912   ALT 21 10/15/2016 0954   ALT 12 09/17/2016 1020   BILITOT 0.8 03/06/2021 0912   BILITOT 0.87 09/17/2016 1020      Impression and Plan: Cindy Keller is a very pleasant 81 yo caucasian female 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  She is not symptomatic with this.  Again, I am happy that her hemoglobin is doing so well right now.  I am not sure if we need to do anything with this hematoma.  Is not that big.  I told her to put a little ice on this.  Of note, when we last saw her, her LDH was quite high at almost 1500.  We will have to watch this closely.  I am not sure about the weight loss.  Her weight is holding pretty stable compared to 2 months ago.  However, we may have to think about doing a scan on her.   I still think we need to follow her closely.  I would like to see her back probably in about 6 weeks or so.    Volanda Napoleon, MD 2/1/202311:23 AM

## 2021-03-20 DIAGNOSIS — H34812 Central retinal vein occlusion, left eye, with macular edema: Secondary | ICD-10-CM | POA: Diagnosis not present

## 2021-04-03 DIAGNOSIS — H34812 Central retinal vein occlusion, left eye, with macular edema: Secondary | ICD-10-CM | POA: Diagnosis not present

## 2021-04-03 DIAGNOSIS — H43813 Vitreous degeneration, bilateral: Secondary | ICD-10-CM | POA: Diagnosis not present

## 2021-04-15 ENCOUNTER — Other Ambulatory Visit: Payer: Self-pay | Admitting: Hematology & Oncology

## 2021-04-15 DIAGNOSIS — D72820 Lymphocytosis (symptomatic): Secondary | ICD-10-CM

## 2021-04-15 DIAGNOSIS — D5912 Cold autoimmune hemolytic anemia: Secondary | ICD-10-CM

## 2021-04-17 ENCOUNTER — Inpatient Hospital Stay: Payer: Medicare Other | Attending: Hematology & Oncology

## 2021-04-17 ENCOUNTER — Other Ambulatory Visit: Payer: Self-pay

## 2021-04-17 ENCOUNTER — Telehealth: Payer: Self-pay | Admitting: *Deleted

## 2021-04-17 ENCOUNTER — Inpatient Hospital Stay: Payer: Medicare Other | Admitting: Hematology & Oncology

## 2021-04-17 ENCOUNTER — Encounter: Payer: Self-pay | Admitting: Hematology & Oncology

## 2021-04-17 VITALS — BP 117/73 | HR 75 | Temp 98.0°F | Resp 18 | Ht 62.99 in | Wt 108.0 lb

## 2021-04-17 DIAGNOSIS — S7001XA Contusion of right hip, initial encounter: Secondary | ICD-10-CM | POA: Insufficient documentation

## 2021-04-17 DIAGNOSIS — R7402 Elevation of levels of lactic acid dehydrogenase (LDH): Secondary | ICD-10-CM | POA: Diagnosis not present

## 2021-04-17 DIAGNOSIS — J90 Pleural effusion, not elsewhere classified: Secondary | ICD-10-CM | POA: Insufficient documentation

## 2021-04-17 DIAGNOSIS — R634 Abnormal weight loss: Secondary | ICD-10-CM | POA: Diagnosis not present

## 2021-04-17 DIAGNOSIS — R590 Localized enlarged lymph nodes: Secondary | ICD-10-CM | POA: Insufficient documentation

## 2021-04-17 DIAGNOSIS — D582 Other hemoglobinopathies: Secondary | ICD-10-CM | POA: Insufficient documentation

## 2021-04-17 DIAGNOSIS — D72820 Lymphocytosis (symptomatic): Secondary | ICD-10-CM | POA: Diagnosis not present

## 2021-04-17 DIAGNOSIS — Z79899 Other long term (current) drug therapy: Secondary | ICD-10-CM | POA: Diagnosis not present

## 2021-04-17 DIAGNOSIS — D473 Essential (hemorrhagic) thrombocythemia: Secondary | ICD-10-CM

## 2021-04-17 LAB — CMP (CANCER CENTER ONLY)
ALT: 10 U/L (ref 0–44)
AST: 29 U/L (ref 15–41)
Albumin: 5.1 g/dL — ABNORMAL HIGH (ref 3.5–5.0)
Alkaline Phosphatase: 66 U/L (ref 38–126)
Anion gap: 9 (ref 5–15)
BUN: 13 mg/dL (ref 8–23)
CO2: 26 mmol/L (ref 22–32)
Calcium: 10.2 mg/dL (ref 8.9–10.3)
Chloride: 103 mmol/L (ref 98–111)
Creatinine: 1.07 mg/dL — ABNORMAL HIGH (ref 0.44–1.00)
GFR, Estimated: 52 mL/min — ABNORMAL LOW (ref >60)
Glucose, Bld: 98 mg/dL (ref 70–99)
Potassium: 4.6 mmol/L (ref 3.5–5.1)
Sodium: 138 mmol/L (ref 135–145)
Total Bilirubin: 0.8 mg/dL (ref 0.3–1.2)
Total Protein: 6.9 g/dL (ref 6.5–8.1)

## 2021-04-17 LAB — CBC WITH DIFFERENTIAL (CANCER CENTER ONLY)
Abs Immature Granulocytes: 0.3 10*3/uL — ABNORMAL HIGH (ref 0.00–0.07)
Basophils Absolute: 0 10*3/uL (ref 0.0–0.1)
Basophils Relative: 0 %
Eosinophils Absolute: 0 10*3/uL (ref 0.0–0.5)
Eosinophils Relative: 0 %
HCT: 36.2 % (ref 36.0–46.0)
Hemoglobin: 12 g/dL (ref 12.0–15.0)
Immature Granulocytes: 3 %
Lymphocytes Relative: 18 %
Lymphs Abs: 2 10*3/uL (ref 0.7–4.0)
MCH: 26.5 pg (ref 26.0–34.0)
MCHC: 33.1 g/dL (ref 30.0–36.0)
MCV: 80.1 fL (ref 80.0–100.0)
Monocytes Absolute: 1 10*3/uL (ref 0.1–1.0)
Monocytes Relative: 9 %
Neutro Abs: 8 10*3/uL — ABNORMAL HIGH (ref 1.7–7.7)
Neutrophils Relative %: 70 %
Platelet Count: 299 10*3/uL (ref 150–400)
RBC: 4.52 MIL/uL (ref 3.87–5.11)
RDW: 22.9 % — ABNORMAL HIGH (ref 11.5–15.5)
WBC Count: 11.4 10*3/uL — ABNORMAL HIGH (ref 4.0–10.5)
nRBC: 0.5 % — ABNORMAL HIGH (ref 0.0–0.2)

## 2021-04-17 LAB — FERRITIN: Ferritin: 103 ng/mL (ref 11–307)

## 2021-04-17 LAB — RETICULOCYTES
Immature Retic Fract: 23 % — ABNORMAL HIGH (ref 2.3–15.9)
RBC.: 4.6 MIL/uL (ref 3.87–5.11)
Retic Count, Absolute: 104.4 10*3/uL (ref 19.0–186.0)
Retic Ct Pct: 2.3 % (ref 0.4–3.1)

## 2021-04-17 LAB — IRON AND IRON BINDING CAPACITY (CC-WL,HP ONLY)
Iron: 169 ug/dL (ref 28–170)
Saturation Ratios: 44 % — ABNORMAL HIGH (ref 10.4–31.8)
TIBC: 388 ug/dL (ref 250–450)
UIBC: 219 ug/dL (ref 148–442)

## 2021-04-17 LAB — LACTATE DEHYDROGENASE: LDH: 1309 U/L — ABNORMAL HIGH (ref 98–192)

## 2021-04-17 NOTE — Telephone Encounter (Signed)
Per 04/17/21 los - called and gave upcoming appointments - confirmed ?

## 2021-04-17 NOTE — Progress Notes (Signed)
?Hematology and Oncology Follow Up Visit ? ?Cindy Keller ?[id number] ?July 06, 1940 81 y.o. ?04/17/2021 ? ? ?Principle Diagnosis:  ?Hemoglobin AC disease ?B-cell lymphoproliferative disorder - flow cytometry (+) ? ?Current Therapy:   ?Folic acid 2 mg po q day ?  ?Interim History:  Ms. Cindy Keller is here today for follow-up.  I wanted her to come back little bit early because of the weight loss.  Her weight is holding steady right now. ? ?She feels okay otherwise.  She has had no problems with nausea or vomiting.  Is been no change in bowel or bladder habits.  She has had no rashes.  There is been no leg swelling.  She has not noted any palpable lymph nodes. ? ?Her iron studies we last saw her showed a ferritin of 102 with an iron saturation of 32%. ? ?Of note, back in December, her LDH was quite high at almost 1500.  We are repeating this to see if there is any changes. ? ?Her last CT scan was done back in 2022.  She has a borderline enlarged mediastinal lymph nodes.  She had emphysematous changes.  She has some pleural effusions. ? ?Currently, I would say performance status is ECOG 1.    ?  ?Medications:  ?Allergies as of 04/17/2021   ? ?   Reactions  ? Codeine Other (See Comments)  ? restless ?jittery  ? Alendronate Sodium Other (See Comments)  ? Benadryl [diphenhydramine Hcl] Other (See Comments)  ? restless  ? Diphenhydramine Other (See Comments)  ? Lisinopril Other (See Comments)  ? Ciprofloxacin Rash, Hives, Other (See Comments)  ? ?  ? ?  ?Medication List  ?  ? ?  ? Accurate as of April 17, 2021 10:31 AM. If you have any questions, ask your nurse or doctor.  ?  ?  ? ?  ? ?aspirin EC 81 MG tablet ?Take 81 mg by mouth daily. ?  ?Calcium Carbonate-Vit D-Min 1200-1000 MG-UNIT Chew ?Chew 1 capsule by mouth daily. ?  ?ferrous sulfate 325 (65 FE) MG tablet ?Take 1 tablet (325 mg total) by mouth 2 (two) times daily with a meal. ?  ?folic acid 1 MG tablet ?Commonly known as: FOLVITE ?TAKE 2 TABLETS BY MOUTH EVERY DAY ?   ?losartan 100 MG tablet ?Commonly known as: COZAAR ?Take 1 tablet (100 mg total) by mouth daily. ?  ?pantoprazole 40 MG tablet ?Commonly known as: PROTONIX ?Take 40 mg by mouth 2 (two) times daily. ?  ?rosuvastatin 5 MG tablet ?Commonly known as: CRESTOR ?Take 5 mg by mouth daily. ?  ?tetrahydrozoline 0.05 % ophthalmic solution ?Place 1 drop into both eyes daily. ?  ?vitamin C 500 MG tablet ?Commonly known as: ASCORBIC ACID ?Take 500 mg by mouth daily. ?  ?Vitamin D 1000 units capsule ?Take 1,000 Units by mouth daily. ?  ? ?  ? ? ?Allergies:  ?Allergies  ?Allergen Reactions  ? Codeine Other (See Comments)  ?  restless ?jittery ?  ? Alendronate Sodium Other (See Comments)  ? Benadryl [Diphenhydramine Hcl] Other (See Comments)  ?  restless  ? Diphenhydramine Other (See Comments)  ? Lisinopril Other (See Comments)  ? Ciprofloxacin Rash, Hives and Other (See Comments)  ? ? ?Past Medical History, Surgical history, Social history, and Family History were reviewed and updated. ? ?Review of Systems: ?Review of Systems  ?Constitutional: Negative.   ?Eyes: Negative.   ?Respiratory: Negative.    ?Cardiovascular: Negative.   ?Gastrointestinal: Negative.   ?Genitourinary: Negative.   ?  Musculoskeletal: Negative.   ?Skin: Negative.  Negative for rash.  ?Neurological: Negative.   ?Endo/Heme/Allergies: Negative.   ?Psychiatric/Behavioral: Negative.    ?All other systems reviewed and are negative. ? ? ?Physical Exam: ? height is 5' 2.99" (1.6 m) and weight is 49 kg. Her oral temperature is 98 ?F (36.7 ?C). Her blood pressure is 117/73 and her pulse is 75. Her respiration is 18 and oxygen saturation is 99%.  ? ?Wt Readings from Last 3 Encounters:  ?04/17/21 49 kg  ?03/06/21 49 kg  ?01/09/21 49.4 kg  ? ? ?Physical Exam ?Vitals reviewed.  ?HENT:  ?   Head: Normocephalic and atraumatic.  ?Eyes:  ?   Pupils: Pupils are equal, round, and reactive to light.  ?Cardiovascular:  ?   Rate and Rhythm: Normal rate and regular rhythm.  ?   Heart  sounds: Normal heart sounds.  ?Pulmonary:  ?   Effort: Pulmonary effort is normal.  ?   Breath sounds: Normal breath sounds.  ?Abdominal:  ?   General: Bowel sounds are normal.  ?   Palpations: Abdomen is soft.  ?   Comments: On abdominal exam over the right hip area, there is an ecchymoses.  There is a subcutaneous hematoma that we can feel.  Is probably about a centimeter in size.  It is mobile and slightly tender.  There is no palpable splenomegaly.  There is no inguinal adenopathy.  ?Musculoskeletal:     ?   General: No tenderness or deformity. Normal range of motion.  ?   Cervical back: Normal range of motion.  ?Lymphadenopathy:  ?   Cervical: No cervical adenopathy.  ?Skin: ?   General: Skin is warm and dry.  ?   Findings: No erythema or rash.  ?Neurological:  ?   Mental Status: She is alert and oriented to person, place, and time.  ?Psychiatric:     ?   Behavior: Behavior normal.     ?   Thought Content: Thought content normal.     ?   Judgment: Judgment normal.  ? ? ?Lab Results  ?Component Value Date  ? WBC 11.4 (H) 04/17/2021  ? HGB 12.0 04/17/2021  ? HCT 36.2 04/17/2021  ? MCV 80.1 04/17/2021  ? PLT 299 04/17/2021  ? ?Lab Results  ?Component Value Date  ? FERRITIN 102 03/06/2021  ? IRON 114 03/06/2021  ? TIBC 358 03/06/2021  ? UIBC 244 03/06/2021  ? IRONPCTSAT 32 (H) 03/06/2021  ? ?Lab Results  ?Component Value Date  ? RETICCTPCT 2.3 04/17/2021  ? RBC 4.60 04/17/2021  ? RETICCTABS 98.5 08/27/2006  ? ?Lab Results  ?Component Value Date  ? KPAFRELGTCHN 49.6 (H) 02/15/2018  ? LAMBDASER 13.8 02/15/2018  ? KAPLAMBRATIO 3.59 (H) 02/15/2018  ? ?Lab Results  ?Component Value Date  ? IGGSERUM 743 02/15/2018  ? IGA 162 02/15/2018  ? IGMSERUM 333 (H) 02/15/2018  ? ?Lab Results  ?Component Value Date  ? TOTALPROTELP 7.1 12/21/2017  ? ALBUMINELP 4.3 12/21/2017  ? A1GS 0.2 12/21/2017  ? A2GS 0.6 12/21/2017  ? BETS 1.2 12/21/2017  ? GAMS 0.7 12/21/2017  ? MSPIKE Not Observed 12/21/2017  ? ?  Chemistry   ?   ?Component  Value Date/Time  ? NA 138 04/17/2021 0935  ? NA 141 10/15/2016 0954  ? NA 137 09/17/2016 1020  ? K 4.6 04/17/2021 0935  ? K 4.6 10/15/2016 0954  ? K 4.5 09/17/2016 1020  ? CL 103 04/17/2021 0935  ? CL 105 10/15/2016 0954  ?  CO2 26 04/17/2021 0935  ? CO2 28 10/15/2016 0954  ? CO2 26 09/17/2016 1020  ? BUN 13 04/17/2021 0935  ? BUN 8 10/15/2016 0954  ? BUN 8.6 09/17/2016 1020  ? CREATININE 1.07 (H) 04/17/2021 0935  ? CREATININE 1.0 10/15/2016 0954  ? CREATININE 0.9 09/17/2016 1020  ?    ?Component Value Date/Time  ? CALCIUM 10.2 04/17/2021 0935  ? CALCIUM 9.4 10/15/2016 0954  ? CALCIUM 9.8 09/17/2016 1020  ? ALKPHOS 66 04/17/2021 0935  ? ALKPHOS 62 10/15/2016 0954  ? ALKPHOS 71 09/17/2016 1020  ? AST 29 04/17/2021 0935  ? AST 22 09/17/2016 1020  ? ALT 10 04/17/2021 0935  ? ALT 21 10/15/2016 0954  ? ALT 12 09/17/2016 1020  ? BILITOT 0.8 04/17/2021 0935  ? BILITOT 0.87 09/17/2016 1020  ?  ? ? ?Impression and Plan: Ms. 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  She is not symptomatic with this. ? ?I am happy that the weight is holding steady.  Because of this, I think we can just watch her and get her back in a few months.  It will be interesting to see what her LDH is. ? ?I still do not see a need for a bone marrow test.  Again we have to be cautious. ? ?I do not see that we have to do any scans on a right now. ? ? ? Volanda Napoleon, MD ?3/15/202310:31 AM ? ?

## 2021-04-25 DIAGNOSIS — H34812 Central retinal vein occlusion, left eye, with macular edema: Secondary | ICD-10-CM | POA: Diagnosis not present

## 2021-05-24 DIAGNOSIS — R109 Unspecified abdominal pain: Secondary | ICD-10-CM | POA: Diagnosis not present

## 2021-05-26 ENCOUNTER — Emergency Department (HOSPITAL_BASED_OUTPATIENT_CLINIC_OR_DEPARTMENT_OTHER)
Admission: EM | Admit: 2021-05-26 | Discharge: 2021-05-26 | Disposition: A | Discharge status: Home / Self Care | Payer: Medicare Other | Attending: Emergency Medicine | Admitting: Emergency Medicine

## 2021-05-26 ENCOUNTER — Encounter (HOSPITAL_BASED_OUTPATIENT_CLINIC_OR_DEPARTMENT_OTHER): Payer: Self-pay

## 2021-05-26 ENCOUNTER — Other Ambulatory Visit: Payer: Self-pay

## 2021-05-26 DIAGNOSIS — R338 Other retention of urine: Secondary | ICD-10-CM

## 2021-05-26 DIAGNOSIS — R112 Nausea with vomiting, unspecified: Secondary | ICD-10-CM | POA: Diagnosis not present

## 2021-05-26 DIAGNOSIS — K5900 Constipation, unspecified: Secondary | ICD-10-CM | POA: Insufficient documentation

## 2021-05-26 DIAGNOSIS — Z79899 Other long term (current) drug therapy: Secondary | ICD-10-CM | POA: Diagnosis not present

## 2021-05-26 DIAGNOSIS — R339 Retention of urine, unspecified: Secondary | ICD-10-CM | POA: Insufficient documentation

## 2021-05-26 DIAGNOSIS — D72829 Elevated white blood cell count, unspecified: Secondary | ICD-10-CM | POA: Diagnosis not present

## 2021-05-26 DIAGNOSIS — I110 Hypertensive heart disease with heart failure: Secondary | ICD-10-CM | POA: Diagnosis not present

## 2021-05-26 DIAGNOSIS — I509 Heart failure, unspecified: Secondary | ICD-10-CM | POA: Insufficient documentation

## 2021-05-26 DIAGNOSIS — D509 Iron deficiency anemia, unspecified: Secondary | ICD-10-CM

## 2021-05-26 DIAGNOSIS — E871 Hypo-osmolality and hyponatremia: Secondary | ICD-10-CM

## 2021-05-26 DIAGNOSIS — R748 Abnormal levels of other serum enzymes: Secondary | ICD-10-CM

## 2021-05-26 DIAGNOSIS — Z7982 Long term (current) use of aspirin: Secondary | ICD-10-CM | POA: Insufficient documentation

## 2021-05-26 LAB — CBC WITH DIFFERENTIAL/PLATELET
Abs Immature Granulocytes: 0.16 10*3/uL — ABNORMAL HIGH (ref 0.00–0.07)
Basophils Absolute: 0 10*3/uL (ref 0.0–0.1)
Basophils Relative: 0 %
Eosinophils Absolute: 0.2 10*3/uL (ref 0.0–0.5)
Eosinophils Relative: 1 %
HCT: 30.1 % — ABNORMAL LOW (ref 36.0–46.0)
Hemoglobin: 10.3 g/dL — ABNORMAL LOW (ref 12.0–15.0)
Immature Granulocytes: 1 %
Lymphocytes Relative: 8 %
Lymphs Abs: 1.2 10*3/uL (ref 0.7–4.0)
MCH: 25.8 pg — ABNORMAL LOW (ref 26.0–34.0)
MCHC: 34.2 g/dL (ref 30.0–36.0)
MCV: 75.4 fL — ABNORMAL LOW (ref 80.0–100.0)
Monocytes Absolute: 1 10*3/uL (ref 0.1–1.0)
Monocytes Relative: 7 %
Neutro Abs: 13 10*3/uL — ABNORMAL HIGH (ref 1.7–7.7)
Neutrophils Relative %: 83 %
Platelets: 247 10*3/uL (ref 150–400)
RBC: 3.99 MIL/uL (ref 3.87–5.11)
RDW: 22.5 % — ABNORMAL HIGH (ref 11.5–15.5)
Smear Review: NORMAL
WBC: 15.6 10*3/uL — ABNORMAL HIGH (ref 4.0–10.5)
nRBC: 0.3 % — ABNORMAL HIGH (ref 0.0–0.2)

## 2021-05-26 LAB — URINALYSIS, ROUTINE W REFLEX MICROSCOPIC
Bilirubin Urine: NEGATIVE
Glucose, UA: NEGATIVE mg/dL
Hgb urine dipstick: NEGATIVE
Ketones, ur: NEGATIVE mg/dL
Leukocytes,Ua: NEGATIVE
Nitrite: NEGATIVE
Protein, ur: NEGATIVE mg/dL
Specific Gravity, Urine: 1.01 (ref 1.005–1.030)
pH: 5.5 (ref 5.0–8.0)

## 2021-05-26 LAB — COMPREHENSIVE METABOLIC PANEL
ALT: 11 U/L (ref 0–44)
AST: 28 U/L (ref 15–41)
Albumin: 4.1 g/dL (ref 3.5–5.0)
Alkaline Phosphatase: 76 U/L (ref 38–126)
Anion gap: 12 (ref 5–15)
BUN: 19 mg/dL (ref 8–23)
CO2: 18 mmol/L — ABNORMAL LOW (ref 22–32)
Calcium: 9.1 mg/dL (ref 8.9–10.3)
Chloride: 97 mmol/L — ABNORMAL LOW (ref 98–111)
Creatinine, Ser: 1.24 mg/dL — ABNORMAL HIGH (ref 0.44–1.00)
GFR, Estimated: 44 mL/min — ABNORMAL LOW (ref >60)
Glucose, Bld: 110 mg/dL — ABNORMAL HIGH (ref 70–99)
Potassium: 3.6 mmol/L (ref 3.5–5.1)
Sodium: 127 mmol/L — ABNORMAL LOW (ref 135–145)
Total Bilirubin: 0.7 mg/dL (ref 0.3–1.2)
Total Protein: 6.9 g/dL (ref 6.5–8.1)

## 2021-05-26 LAB — LIPASE, BLOOD: Lipase: 54 U/L — ABNORMAL HIGH (ref 11–51)

## 2021-05-26 MED ORDER — BISACODYL 5 MG PO TBEC
5.0000 mg | DELAYED_RELEASE_TABLET | Freq: Once | ORAL | Status: AC
Start: 2021-05-26 — End: 2021-05-26
  Administered 2021-05-26: 5 mg via ORAL
  Filled 2021-05-26: qty 1

## 2021-05-26 NOTE — ED Triage Notes (Signed)
Abdominal pain and swelling. Patient states she can't remember the last time she had a normal BM. Was seen by PCP on Friday and was started on miralax which she states has not worked. ?

## 2021-05-26 NOTE — Discharge Instructions (Signed)
Continue using polyethylene glycol (MiraLAX) as needed.  This is a laxative that is safe to use every day.  On an occasional basis, you may take bisacodyl (Dulcolax) to stimulate your bowels to have a bowel movement.  Bisacodyl is not safe to use every day. ? ?Please make sure to try to empty her bladder every few hours.  It is possible that you will go back to retaining urine.  If that happens, you may need to have a catheter inserted and leave it in.  Please follow-up with the urologist to try to find out why you were retaining urine and to see what can be done to prevent it from happening again. ?

## 2021-05-26 NOTE — ED Notes (Signed)
Bladder scan: Greater than 663 mL ?

## 2021-05-26 NOTE — ED Provider Notes (Signed)
?[facility] ?Provider Note ? ? ?CSN: [id number] ?Arrival date & time: 05/26/21  0245 ? ?  ? ?History ? ?Chief Complaint  ?Patient presents with  ? Constipation  ? ? ?Cindy Keller is a 81 y.o. female. ? ?The history is provided by the patient.  ?Constipation ?She has history of hypertension, hyperlipidemia, heart failure, essential thrombocytosis, monoclonal B-cell lymphocytosis and comes in because of abdominal pain and constipation.  She states that she has not had a bowel movement in the last 4 days.  She noted that her abdomen was getting rockhard although not actually painful.  There was some nausea but no vomiting.  She did see her PCP who recommended she take polyethylene glycol and she has taken 3 doses without any bowel movements.  She is passing a small amount of mucoid material but no blood.  She denies fever, chills, sweats.  There has been some nausea with minimal vomiting. ?  ?Home Medications ?Prior to Admission medications   ?Medication Sig Start Date End Date Taking? Authorizing Provider  ?Ascorbic Acid (VITAMIN C) 500 MG tablet Take 500 mg by mouth daily.    [provider]  ?aspirin EC 81 MG tablet Take 81 mg by mouth daily.    [provider]  ?Calcium Carbonate-Vit D-Min 1200-1000 MG-UNIT CHEW Chew 1 capsule by mouth daily.    [provider]  ?Cholecalciferol (VITAMIN D) 1000 UNITS capsule Take 1,000 Units by mouth daily.    [provider]  ?ferrous sulfate 325 (65 FE) MG tablet Take 1 tablet (325 mg total) by mouth 2 (two) times daily with a meal. 10/14/20   Thurnell Lose, MD  ?folic acid (FOLVITE) 1 MG tablet TAKE 2 TABLETS BY MOUTH EVERY DAY 04/15/21   Volanda Napoleon, MD  ?losartan (COZAAR) 100 MG tablet Take 1 tablet (100 mg total) by mouth daily. 10/15/20   Thurnell Lose, MD  ?pantoprazole (PROTONIX) 40 MG tablet Take 40 mg by mouth 2 (two) times daily. 09/18/20   [provider]  ?rosuvastatin (CRESTOR)  5 MG tablet Take 5 mg by mouth daily. 08/22/20   [provider]  ?tetrahydrozoline 0.05 % ophthalmic solution Place 1 drop into both eyes daily.    [provider]  ?   ? ?Allergies    ?Codeine, Alendronate sodium, Benadryl [diphenhydramine hcl], Diphenhydramine, Lisinopril, and Ciprofloxacin   ? ?Review of Systems   ?Review of Systems  ?Gastrointestinal:  Positive for constipation.  ?All other systems reviewed and are negative. ? ?Physical Exam ?Updated Vital Signs ?BP (!) 156/70 (BP Location: Right Arm)   Pulse 78   Temp 98.4 ?F (36.9 ?C) (Oral)   Resp 14   Ht '5\' 2"'$  (1.575 m)   Wt 49 kg   SpO2 99%   BMI 19.75 kg/m?  ?Physical Exam ?Vitals and nursing note reviewed.  ?81 year old female, resting comfortably and in no acute distress. Vital signs are significant for elevated blood pressure. Oxygen saturation is 99%, which is normal. ?Head is normocephalic and atraumatic. PERRLA, EOMI. Oropharynx is clear. ?Neck is nontender and supple without adenopathy or JVD. ?Back is nontender and there is no CVA tenderness. ?Lungs are clear without rales, wheezes, or rhonchi. ?Chest is nontender. ?Heart has regular rate and rhythm without murmur. ?Abdomen is soft and nontender.  Bladder is distended to the umbilicus,, but no other masses or hepatosplenomegaly. ?Rectal: Normal sphincter tone.  No stool present in the rectal ampulla. ?Extremities have 1+ edema,  full range of motion is present. ?Skin is warm and dry without rash. ?Neurologic: Mental status is normal, cranial nerves are intact, moves all extremities equally. ? ?ED Results / Procedures / Treatments   ?Labs ?(all labs ordered are listed, but only abnormal results are displayed) ?Labs Reviewed  ?COMPREHENSIVE METABOLIC PANEL - Abnormal; Notable for the following components:  ?    Result Value  ? Sodium 127 (*)   ? Chloride 97 (*)   ? CO2 18 (*)   ? Glucose, Bld 110 (*)   ? Creatinine, Ser 1.24 (*)   ? GFR, Estimated 44 (*)   ? All other  components within normal limits  ?LIPASE, BLOOD - Abnormal; Notable for the following components:  ? Lipase 54 (*)   ? All other components within normal limits  ?CBC WITH DIFFERENTIAL/PLATELET - Abnormal; Notable for the following components:  ? WBC 15.6 (*)   ? Hemoglobin 10.3 (*)   ? HCT 30.1 (*)   ? MCV 75.4 (*)   ? MCH 25.8 (*)   ? RDW 22.5 (*)   ? nRBC 0.3 (*)   ? Neutro Abs 13.0 (*)   ? Abs Immature Granulocytes 0.16 (*)   ? All other components within normal limits  ?URINALYSIS, ROUTINE W REFLEX MICROSCOPIC  ? ?Procedures ?Procedures  ? ? ?Medications Ordered in ED ?Medications  ?bisacodyl (DULCOLAX) EC tablet 5 mg (has no administration in time range)  ? ? ?ED Course/ Medical Decision Making/ A&P ?  ?                        ?Medical Decision Making ?Amount and/or Complexity of Data Reviewed ?Labs: ordered. ? ?Risk ?OTC drugs. ? ? ?Constipation blood without stool present in the rectal vault.  Apparent urinary retention.  Bladder scan was obtained which confirmed bladder with greater than 600 mL of urine, will do straight cath to drain the bladder and see if she is feeling any better.  We will also check screening labs. ? ?Patient had 1500 mL of urine drained from her bladder and she feels much better.  Labs do show mild hyponatremia which is not felt to be clinically significant, creatinine is elevated slightly over baseline but not significantly.  Leukocytosis is present, but not significantly changed from her baseline.  Anemia is slightly more prominent than previously, but in a range that she has been out in the past.  Lipase is minimally elevated and not felt to be clinically significant.  Urinalysis is normal.  At this point, it seems that most of her symptoms were related to urinary retention.  She will be given a trial of going home with no catheter but it is advised that she may go back to urinary retention at which point she would need a Foley catheter.  She is given a dose of bisacodyl to try to  promote bowel movement today.  She is referred to urology for outpatient evaluation of urinary retention, referred back to her PCP and her oncologist for general medical management. ? ?Final Clinical Impression(s) / ED Diagnoses ?Final diagnoses:  ?Acute urinary retention  ?Constipation, unspecified constipation type  ?Hyponatremia  ?Microcytic anemia  ?Elevated lipase  ? ? ?Rx / DC Orders ?ED Discharge Orders   ? ? None  ? ?  ? ? ?  ?Delora Fuel, MD ?63/[birth year]/60 [id number] ? ?

## 2021-05-30 DIAGNOSIS — R310 Gross hematuria: Secondary | ICD-10-CM | POA: Diagnosis not present

## 2021-05-30 DIAGNOSIS — R350 Frequency of micturition: Secondary | ICD-10-CM | POA: Diagnosis not present

## 2021-05-30 DIAGNOSIS — R338 Other retention of urine: Secondary | ICD-10-CM | POA: Diagnosis not present

## 2021-05-30 DIAGNOSIS — R339 Retention of urine, unspecified: Secondary | ICD-10-CM | POA: Diagnosis not present

## 2021-06-05 DIAGNOSIS — H34812 Central retinal vein occlusion, left eye, with macular edema: Secondary | ICD-10-CM | POA: Diagnosis not present

## 2021-06-17 DIAGNOSIS — R310 Gross hematuria: Secondary | ICD-10-CM | POA: Diagnosis not present

## 2021-06-17 DIAGNOSIS — R338 Other retention of urine: Secondary | ICD-10-CM | POA: Diagnosis not present

## 2021-06-18 DIAGNOSIS — R310 Gross hematuria: Secondary | ICD-10-CM | POA: Diagnosis not present

## 2021-06-18 DIAGNOSIS — N3289 Other specified disorders of bladder: Secondary | ICD-10-CM | POA: Diagnosis not present

## 2021-06-26 DIAGNOSIS — N319 Neuromuscular dysfunction of bladder, unspecified: Secondary | ICD-10-CM | POA: Diagnosis not present

## 2021-06-26 DIAGNOSIS — R310 Gross hematuria: Secondary | ICD-10-CM | POA: Diagnosis not present

## 2021-06-26 DIAGNOSIS — R338 Other retention of urine: Secondary | ICD-10-CM | POA: Diagnosis not present

## 2021-06-28 ENCOUNTER — Emergency Department (HOSPITAL_BASED_OUTPATIENT_CLINIC_OR_DEPARTMENT_OTHER)
Admission: EM | Admit: 2021-06-28 | Discharge: 2021-06-28 | Disposition: A | Discharge status: Home / Self Care | Payer: Medicare Other | Attending: Emergency Medicine | Admitting: Emergency Medicine

## 2021-06-28 ENCOUNTER — Other Ambulatory Visit: Payer: Self-pay

## 2021-06-28 ENCOUNTER — Encounter (HOSPITAL_BASED_OUTPATIENT_CLINIC_OR_DEPARTMENT_OTHER): Payer: Self-pay

## 2021-06-28 DIAGNOSIS — R339 Retention of urine, unspecified: Secondary | ICD-10-CM | POA: Insufficient documentation

## 2021-06-28 DIAGNOSIS — I10 Essential (primary) hypertension: Secondary | ICD-10-CM | POA: Diagnosis not present

## 2021-06-28 DIAGNOSIS — K5900 Constipation, unspecified: Secondary | ICD-10-CM | POA: Insufficient documentation

## 2021-06-28 DIAGNOSIS — N390 Urinary tract infection, site not specified: Secondary | ICD-10-CM | POA: Insufficient documentation

## 2021-06-28 DIAGNOSIS — Z7982 Long term (current) use of aspirin: Secondary | ICD-10-CM | POA: Insufficient documentation

## 2021-06-28 DIAGNOSIS — Z79899 Other long term (current) drug therapy: Secondary | ICD-10-CM | POA: Insufficient documentation

## 2021-06-28 LAB — URINALYSIS, MICROSCOPIC (REFLEX): WBC, UA: >50 WBC/hpf (ref 0–5)

## 2021-06-28 LAB — CBC WITH DIFFERENTIAL/PLATELET
Abs Immature Granulocytes: 0 10*3/uL (ref 0.00–0.07)
Basophils Absolute: 0 10*3/uL (ref 0.0–0.1)
Basophils Relative: 0 %
Eosinophils Absolute: 0 10*3/uL (ref 0.0–0.5)
Eosinophils Relative: 0 %
HCT: 33.3 % — ABNORMAL LOW (ref 36.0–46.0)
Hemoglobin: 11.2 g/dL — ABNORMAL LOW (ref 12.0–15.0)
Lymphocytes Relative: 9 %
Lymphs Abs: 1.2 10*3/uL (ref 0.7–4.0)
MCH: 26 pg (ref 26.0–34.0)
MCHC: 33.6 g/dL (ref 30.0–36.0)
MCV: 77.3 fL — ABNORMAL LOW (ref 80.0–100.0)
Monocytes Absolute: 0.3 10*3/uL (ref 0.1–1.0)
Monocytes Relative: 2 %
Neutro Abs: 10.5 10*3/uL — ABNORMAL HIGH (ref 1.7–7.7)
Neutrophils Relative %: 78 %
Other: 11 %
Platelets: 276 10*3/uL (ref 150–400)
RBC: 4.31 MIL/uL (ref 3.87–5.11)
RDW: 21.8 % — ABNORMAL HIGH (ref 11.5–15.5)
Smear Review: NORMAL
WBC: 13.5 10*3/uL — ABNORMAL HIGH (ref 4.0–10.5)
nRBC: 0.4 % — ABNORMAL HIGH (ref 0.0–0.2)

## 2021-06-28 LAB — URINALYSIS, ROUTINE W REFLEX MICROSCOPIC
Bilirubin Urine: NEGATIVE
Glucose, UA: NEGATIVE mg/dL
Ketones, ur: NEGATIVE mg/dL
Nitrite: NEGATIVE
Protein, ur: NEGATIVE mg/dL
Specific Gravity, Urine: 1.01 (ref 1.005–1.030)
pH: 6 (ref 5.0–8.0)

## 2021-06-28 LAB — COMPREHENSIVE METABOLIC PANEL
ALT: 9 U/L (ref 0–44)
AST: 28 U/L (ref 15–41)
Albumin: 4.2 g/dL (ref 3.5–5.0)
Alkaline Phosphatase: 76 U/L (ref 38–126)
Anion gap: 9 (ref 5–15)
BUN: 11 mg/dL (ref 8–23)
CO2: 23 mmol/L (ref 22–32)
Calcium: 9.2 mg/dL (ref 8.9–10.3)
Chloride: 101 mmol/L (ref 98–111)
Creatinine, Ser: 0.88 mg/dL (ref 0.44–1.00)
GFR, Estimated: >60 mL/min (ref >60)
Glucose, Bld: 98 mg/dL (ref 70–99)
Potassium: 3.8 mmol/L (ref 3.5–5.1)
Sodium: 133 mmol/L — ABNORMAL LOW (ref 135–145)
Total Bilirubin: 1.1 mg/dL (ref 0.3–1.2)
Total Protein: 6.7 g/dL (ref 6.5–8.1)

## 2021-06-28 MED ORDER — SODIUM CHLORIDE 0.9 % IV SOLN
1.0000 g | Freq: Once | INTRAVENOUS | Status: AC
  Administered 2021-06-28: 1 g via INTRAVENOUS
  Filled 2021-06-28: qty 10

## 2021-06-28 MED ORDER — SODIUM CHLORIDE 0.9 % IV BOLUS
500.0000 mL | Freq: Once | INTRAVENOUS | Status: AC
  Administered 2021-06-28: 500 mL via INTRAVENOUS

## 2021-06-28 MED ORDER — CEFDINIR 300 MG PO CAPS: 300.0000 mg | ORAL_CAPSULE | Freq: Two times a day (BID) | ORAL | 0 refills | Status: AC

## 2021-06-28 NOTE — ED Provider Notes (Addendum)
[facility]PART[facility] Provider Note   CSN: [id number] Arrival date & time: 06/28/21  2440     History  Chief Complaint  Patient presents with   Urinary Retention    Cindy Keller is a 81 y.o. female.  The history is provided by the patient and medical records.  Cindy Keller is a 81 y.o. female who presents to the Emergency Department complaining of difficulty urinating.  She presents to the ED accompanied by her husband due to difficulty urinating since Has required foley catheter in the past  - removed about a week ago Tried self caths at home about a week ago and did not have success.    Last urinated a few days ago.  She is having constipation, last BM yesterday.   No fever, N/V.  Has lower abdominal pain for the last few days.    Has a hx/o HTN.    Has chronic back pain - unchanged from baseline.  No new numbness/weakness in legs.      Home Medications Prior to Admission medications   Medication Sig Start Date End Date Taking? Authorizing Provider  cefdinir (OMNICEF) 300 MG capsule Take 1 capsule (300 mg total) by mouth 2 (two) times daily. 06/28/21  Yes Quintella Reichert, MD  Ascorbic Acid (VITAMIN C) 500 MG tablet Take 500 mg by mouth daily.    [provider]  aspirin EC 81 MG tablet Take 81 mg by mouth daily.    [provider]  Calcium Carbonate-Vit D-Min 1200-1000 MG-UNIT CHEW Chew 1 capsule by mouth daily.    [provider]  Cholecalciferol (VITAMIN D) 1000 UNITS capsule Take 1,000 Units by mouth daily.    [provider]  ferrous sulfate 325 (65 FE) MG tablet Take 1 tablet (325 mg total) by mouth 2 (two) times daily with a meal. 10/14/20   Thurnell Lose, MD  folic acid (FOLVITE) 1 MG tablet TAKE 2 TABLETS BY MOUTH EVERY DAY 04/15/21   Volanda Napoleon, MD  losartan (COZAAR) 100 MG tablet Take 1 tablet (100 mg total) by mouth daily. 10/15/20   Thurnell Lose, MD  pantoprazole (PROTONIX) 40 MG  tablet Take 40 mg by mouth 2 (two) times daily. 09/18/20   [provider]  rosuvastatin (CRESTOR) 5 MG tablet Take 5 mg by mouth daily. 08/22/20   [provider]  tetrahydrozoline 0.05 % ophthalmic solution Place 1 drop into both eyes daily.    [provider]      Allergies    Codeine, Alendronate sodium, Benadryl [diphenhydramine hcl], Diphenhydramine, Lisinopril, and Ciprofloxacin    Review of Systems   Review of Systems  All other systems reviewed and are negative.  Physical Exam Updated Vital Signs BP (!) 150/65   Pulse 69   Temp 98.1 F (36.7 C) (Oral)   Resp 18   Ht '5\' 2"'$  (1.575 m)   Wt 48.5 kg   SpO2 99%   BMI 19.57 kg/m  Physical Exam Vitals and nursing note reviewed.  Constitutional:      Appearance: She is well-developed.  HENT:     Head: Normocephalic and atraumatic.  Cardiovascular:     Rate and Rhythm: Normal rate and regular rhythm.     Heart sounds: No murmur heard. Pulmonary:     Effort: Pulmonary effort is normal. No respiratory distress.     Breath sounds: Normal breath sounds.  Abdominal:     General: There is distension.  Palpations: Abdomen is soft.     Tenderness: There is abdominal tenderness. There is no guarding or rebound.     Comments: Distended lower abdomen from pubic symphysis to above umbilicus.  Moderate lower abdominal tenderness  Musculoskeletal:        General: No swelling or tenderness.  Skin:    General: Skin is warm and dry.  Neurological:     Mental Status: She is alert and oriented to person, place, and time.     Comments: 5/5 strength in BLE with sensation to light touch intact in BLE.   Psychiatric:        Behavior: Behavior normal.    ED Results / Procedures / Treatments   Labs (all labs ordered are listed, but only abnormal results are displayed) Labs Reviewed  COMPREHENSIVE METABOLIC PANEL - Abnormal; Notable for the following components:      Result Value   Sodium 133 (*)    All  other components within normal limits  URINALYSIS, ROUTINE W REFLEX MICROSCOPIC - Abnormal; Notable for the following components:   APPearance TURBID (*)    Hgb urine dipstick TRACE (*)    Leukocytes,Ua LARGE (*)    All other components within normal limits  URINALYSIS, MICROSCOPIC (REFLEX) - Abnormal; Notable for the following components:   Bacteria, UA FEW (*)    All other components within normal limits  URINE CULTURE  CBC WITH DIFFERENTIAL/PLATELET    EKG None  Radiology No results found.  Procedures Procedures    Medications Ordered in ED Medications  cefTRIAXone (ROCEPHIN) 1 g in sodium chloride 0.9 % 100 mL IVPB (1 g Intravenous New Bag/Given 06/28/21 0707)  sodium chloride 0.9 % bolus 500 mL (500 mLs Intravenous New Bag/Given 06/28/21 0707)    ED Course/ Medical Decision Making/ A&P                           Medical Decision Making Amount and/or Complexity of Data Reviewed Labs: ordered.  Risk Prescription drug management.   Patient with history of urinary retention here for evaluation of decreased urinary output for several days.  She has a very distended and tender abdomen on examination.  Bladder scan with greater than 1000 mL in the bladder.  Catheter was placed with immediate return of 1800 cc of urine and it was clamped.  We will start IV fluid hydration.  On repeat abdominal exam patient has a soft and nontender flat abdomen.  She feels significantly improved and would like to be discharged.  Discussed with patient recommendation for IV fluid hydration given degree of urinary output.  BMP with improved renal function when compared to most recent.  She does have mild hyponatremia but this is also improved compared to prior.  UA is concerning for UTI in setting of recent instrumentation and her symptoms, will send culture and start antibiotics.  Dissipate discharge home after treatments in the emergency department.  Care transferred pending CBC.       Final  Clinical Impression(s) / ED Diagnoses Final diagnoses:  Urinary retention  Acute UTI    Rx / DC Orders ED Discharge Orders          Ordered    cefdinir (OMNICEF) 300 MG capsule  2 times daily        06/28/21 0701              Quintella Reichert, MD 06/28/21 8119    Quintella Reichert, MD 06/28/21 [id number]

## 2021-06-28 NOTE — ED Triage Notes (Signed)
Pt complaining of urinary retention since yesterday. Only had a small amount of urine yesterday am

## 2021-06-28 NOTE — ED Notes (Signed)
Pt discharged to home. Discharge instructions have been discussed with patient and/or family members. Pt verbally acknowledges understanding d/c instructions, and endorses comprehension to checkout at registration before leaving.  °

## 2021-06-28 NOTE — ED Notes (Signed)
Pt changed to leg bag and explained how to change. Also provided pt with urinal. Pt education provided.

## 2021-06-29 LAB — PATHOLOGIST SMEAR REVIEW

## 2021-06-30 LAB — URINE CULTURE: Culture: >=100000 — AB

## 2021-07-01 ENCOUNTER — Telehealth: Payer: Self-pay | Admitting: Emergency Medicine

## 2021-07-01 NOTE — Telephone Encounter (Signed)
Post ED Visit - Positive Culture Follow-up  Culture report reviewed by antimicrobial stewardship pharmacist: [facility] '[]'$  Elenor Quinones, Pharm.D. '[]'$  Heide Guile, Pharm.D., BCPS AQ-ID '[]'$  Parks Neptune, Pharm.D., BCPS '[]'$  Alycia Rossetti, Pharm.D., BCPS '[]'$  [place], Pharm.D., BCPS, AAHIVP '[]'$  Legrand Como, Pharm.D., BCPS, AAHIVP '[]'$  Salome Arnt, PharmD, BCPS '[]'$  Johnnette Gourd, PharmD, BCPS '[]'$  Hughes Better, PharmD, BCPS '[]'$  Leeroy Cha, PharmD '[]'$  Laqueta Linden, PharmD, BCPS '[]'$  Albertina Parr, PharmD  [place] Team '[]'$  Leodis Sias, PharmD '[]'$  Lindell Spar, PharmD '[]'$  Royetta Asal, PharmD '[]'$  Graylin Shiver, Rph '[]'$  Rema Fendt) Glennon Mac, PharmD '[]'$  Arlyn Dunning, PharmD '[]'$  Netta Cedars, PharmD '[]'$  Dia Sitter, PharmD '[]'$  Leone Haven, PharmD '[]'$  Gretta Arab, PharmD '[]'$  Theodis Shove, PharmD '[]'$  Peggyann Juba, PharmD '[]'$  Reuel Boom, PharmD   Positive urine culture Treated with cefdinir, organism sensitive to the same and no further patient follow-up is required at this time.  Hazle Nordmann 07/01/2021, 5:14 PM

## 2021-07-08 DIAGNOSIS — I10 Essential (primary) hypertension: Secondary | ICD-10-CM | POA: Diagnosis not present

## 2021-07-08 DIAGNOSIS — E7800 Pure hypercholesterolemia, unspecified: Secondary | ICD-10-CM | POA: Diagnosis not present

## 2021-07-08 DIAGNOSIS — D473 Essential (hemorrhagic) thrombocythemia: Secondary | ICD-10-CM | POA: Diagnosis not present

## 2021-07-08 DIAGNOSIS — Z681 Body mass index (BMI) 19 or less, adult: Secondary | ICD-10-CM | POA: Diagnosis not present

## 2021-07-08 DIAGNOSIS — R338 Other retention of urine: Secondary | ICD-10-CM | POA: Diagnosis not present

## 2021-07-08 DIAGNOSIS — D479 Neoplasm of uncertain behavior of lymphoid, hematopoietic and related tissue, unspecified: Secondary | ICD-10-CM | POA: Diagnosis not present

## 2021-07-11 DIAGNOSIS — H34812 Central retinal vein occlusion, left eye, with macular edema: Secondary | ICD-10-CM | POA: Diagnosis not present

## 2021-07-16 DIAGNOSIS — N319 Neuromuscular dysfunction of bladder, unspecified: Secondary | ICD-10-CM | POA: Diagnosis not present

## 2021-07-16 DIAGNOSIS — R338 Other retention of urine: Secondary | ICD-10-CM | POA: Diagnosis not present

## 2021-07-30 DIAGNOSIS — R338 Other retention of urine: Secondary | ICD-10-CM | POA: Diagnosis not present

## 2021-08-14 DIAGNOSIS — H34812 Central retinal vein occlusion, left eye, with macular edema: Secondary | ICD-10-CM | POA: Diagnosis not present

## 2021-08-22 DIAGNOSIS — R338 Other retention of urine: Secondary | ICD-10-CM | POA: Diagnosis not present

## 2021-08-22 DIAGNOSIS — N319 Neuromuscular dysfunction of bladder, unspecified: Secondary | ICD-10-CM | POA: Diagnosis not present

## 2021-08-28 ENCOUNTER — Ambulatory Visit: Payer: Medicare Other | Admitting: Hematology & Oncology

## 2021-08-28 ENCOUNTER — Other Ambulatory Visit: Payer: Medicare Other

## 2021-09-18 ENCOUNTER — Inpatient Hospital Stay: Payer: Medicare Other | Attending: Family Medicine

## 2021-09-18 ENCOUNTER — Inpatient Hospital Stay: Payer: Medicare Other | Admitting: Hematology & Oncology

## 2021-09-23 DIAGNOSIS — R339 Retention of urine, unspecified: Secondary | ICD-10-CM | POA: Diagnosis not present

## 2021-09-23 DIAGNOSIS — R279 Unspecified lack of coordination: Secondary | ICD-10-CM | POA: Diagnosis not present

## 2021-09-23 DIAGNOSIS — N3000 Acute cystitis without hematuria: Secondary | ICD-10-CM | POA: Diagnosis not present

## 2021-09-23 DIAGNOSIS — S199XXA Unspecified injury of neck, initial encounter: Secondary | ICD-10-CM | POA: Diagnosis not present

## 2021-09-23 DIAGNOSIS — R410 Disorientation, unspecified: Secondary | ICD-10-CM | POA: Diagnosis not present

## 2021-09-23 DIAGNOSIS — D72829 Elevated white blood cell count, unspecified: Secondary | ICD-10-CM | POA: Diagnosis not present

## 2021-09-23 DIAGNOSIS — W1839XA Other fall on same level, initial encounter: Secondary | ICD-10-CM | POA: Diagnosis not present

## 2021-09-23 DIAGNOSIS — M25519 Pain in unspecified shoulder: Secondary | ICD-10-CM | POA: Diagnosis not present

## 2021-09-23 DIAGNOSIS — I480 Paroxysmal atrial fibrillation: Secondary | ICD-10-CM | POA: Diagnosis not present

## 2021-09-23 DIAGNOSIS — R319 Hematuria, unspecified: Secondary | ICD-10-CM | POA: Diagnosis not present

## 2021-09-23 DIAGNOSIS — R778 Other specified abnormalities of plasma proteins: Secondary | ICD-10-CM | POA: Diagnosis not present

## 2021-09-23 DIAGNOSIS — R0602 Shortness of breath: Secondary | ICD-10-CM | POA: Diagnosis not present

## 2021-09-23 DIAGNOSIS — I6529 Occlusion and stenosis of unspecified carotid artery: Secondary | ICD-10-CM | POA: Diagnosis not present

## 2021-09-23 DIAGNOSIS — R9431 Abnormal electrocardiogram [ECG] [EKG]: Secondary | ICD-10-CM | POA: Diagnosis not present

## 2021-09-23 DIAGNOSIS — G9341 Metabolic encephalopathy: Secondary | ICD-10-CM | POA: Diagnosis not present

## 2021-09-23 DIAGNOSIS — Y998 Other external cause status: Secondary | ICD-10-CM | POA: Diagnosis not present

## 2021-09-23 DIAGNOSIS — D649 Anemia, unspecified: Secondary | ICD-10-CM | POA: Diagnosis not present

## 2021-09-23 DIAGNOSIS — R0902 Hypoxemia: Secondary | ICD-10-CM | POA: Diagnosis not present

## 2021-09-23 DIAGNOSIS — Z743 Need for continuous supervision: Secondary | ICD-10-CM | POA: Diagnosis not present

## 2021-09-23 DIAGNOSIS — C9110 Chronic lymphocytic leukemia of B-cell type not having achieved remission: Secondary | ICD-10-CM | POA: Diagnosis not present

## 2021-09-23 DIAGNOSIS — G9340 Encephalopathy, unspecified: Secondary | ICD-10-CM | POA: Diagnosis not present

## 2021-09-23 DIAGNOSIS — J90 Pleural effusion, not elsewhere classified: Secondary | ICD-10-CM | POA: Diagnosis not present

## 2021-09-23 DIAGNOSIS — Z79899 Other long term (current) drug therapy: Secondary | ICD-10-CM | POA: Diagnosis not present

## 2021-09-23 DIAGNOSIS — D5912 Cold autoimmune hemolytic anemia: Secondary | ICD-10-CM | POA: Diagnosis not present

## 2021-09-23 DIAGNOSIS — K219 Gastro-esophageal reflux disease without esophagitis: Secondary | ICD-10-CM | POA: Diagnosis not present

## 2021-09-23 DIAGNOSIS — E785 Hyperlipidemia, unspecified: Secondary | ICD-10-CM | POA: Diagnosis not present

## 2021-09-23 DIAGNOSIS — J9601 Acute respiratory failure with hypoxia: Secondary | ICD-10-CM | POA: Diagnosis not present

## 2021-09-23 DIAGNOSIS — G8929 Other chronic pain: Secondary | ICD-10-CM | POA: Diagnosis not present

## 2021-09-23 DIAGNOSIS — Z885 Allergy status to narcotic agent status: Secondary | ICD-10-CM | POA: Diagnosis not present

## 2021-09-23 DIAGNOSIS — W19XXXA Unspecified fall, initial encounter: Secondary | ICD-10-CM | POA: Diagnosis not present

## 2021-09-23 DIAGNOSIS — M47812 Spondylosis without myelopathy or radiculopathy, cervical region: Secondary | ICD-10-CM | POA: Diagnosis not present

## 2021-09-23 DIAGNOSIS — M549 Dorsalgia, unspecified: Secondary | ICD-10-CM | POA: Diagnosis not present

## 2021-09-23 DIAGNOSIS — Z888 Allergy status to other drugs, medicaments and biological substances status: Secondary | ICD-10-CM | POA: Diagnosis not present

## 2021-09-23 DIAGNOSIS — Z87891 Personal history of nicotine dependence: Secondary | ICD-10-CM | POA: Diagnosis not present

## 2021-09-23 DIAGNOSIS — S0083XA Contusion of other part of head, initial encounter: Secondary | ICD-10-CM | POA: Diagnosis not present

## 2021-09-23 DIAGNOSIS — Z7982 Long term (current) use of aspirin: Secondary | ICD-10-CM | POA: Diagnosis not present

## 2021-09-23 DIAGNOSIS — I5032 Chronic diastolic (congestive) heart failure: Secondary | ICD-10-CM | POA: Diagnosis not present

## 2021-09-23 DIAGNOSIS — E119 Type 2 diabetes mellitus without complications: Secondary | ICD-10-CM | POA: Diagnosis not present

## 2021-09-23 DIAGNOSIS — I110 Hypertensive heart disease with heart failure: Secondary | ICD-10-CM | POA: Diagnosis not present

## 2021-09-23 DIAGNOSIS — S4991XA Unspecified injury of right shoulder and upper arm, initial encounter: Secondary | ICD-10-CM | POA: Diagnosis not present

## 2021-09-23 DIAGNOSIS — T83511A Infection and inflammatory reaction due to indwelling urethral catheter, initial encounter: Secondary | ICD-10-CM | POA: Diagnosis not present

## 2021-09-23 DIAGNOSIS — I672 Cerebral atherosclerosis: Secondary | ICD-10-CM | POA: Diagnosis not present

## 2021-09-23 DIAGNOSIS — S0993XA Unspecified injury of face, initial encounter: Secondary | ICD-10-CM | POA: Diagnosis not present

## 2021-09-23 DIAGNOSIS — N390 Urinary tract infection, site not specified: Secondary | ICD-10-CM | POA: Diagnosis not present

## 2021-09-23 DIAGNOSIS — I10 Essential (primary) hypertension: Secondary | ICD-10-CM | POA: Diagnosis not present

## 2021-09-23 DIAGNOSIS — Z881 Allergy status to other antibiotic agents status: Secondary | ICD-10-CM | POA: Diagnosis not present

## 2021-09-28 DIAGNOSIS — E119 Type 2 diabetes mellitus without complications: Secondary | ICD-10-CM | POA: Diagnosis not present

## 2021-09-28 DIAGNOSIS — I10 Essential (primary) hypertension: Secondary | ICD-10-CM | POA: Diagnosis not present

## 2021-09-28 DIAGNOSIS — I480 Paroxysmal atrial fibrillation: Secondary | ICD-10-CM | POA: Diagnosis not present

## 2021-09-28 DIAGNOSIS — I5032 Chronic diastolic (congestive) heart failure: Secondary | ICD-10-CM | POA: Diagnosis not present

## 2021-09-28 DIAGNOSIS — W19XXXA Unspecified fall, initial encounter: Secondary | ICD-10-CM | POA: Diagnosis not present

## 2021-09-28 DIAGNOSIS — I509 Heart failure, unspecified: Secondary | ICD-10-CM | POA: Diagnosis not present

## 2021-09-28 DIAGNOSIS — D649 Anemia, unspecified: Secondary | ICD-10-CM | POA: Diagnosis not present

## 2021-09-28 DIAGNOSIS — R918 Other nonspecific abnormal finding of lung field: Secondary | ICD-10-CM | POA: Diagnosis not present

## 2021-09-28 DIAGNOSIS — R410 Disorientation, unspecified: Secondary | ICD-10-CM | POA: Diagnosis not present

## 2021-09-28 DIAGNOSIS — G9340 Encephalopathy, unspecified: Secondary | ICD-10-CM | POA: Diagnosis not present

## 2021-09-28 DIAGNOSIS — C9110 Chronic lymphocytic leukemia of B-cell type not having achieved remission: Secondary | ICD-10-CM | POA: Diagnosis not present

## 2021-09-28 DIAGNOSIS — R0902 Hypoxemia: Secondary | ICD-10-CM | POA: Diagnosis not present

## 2021-09-28 DIAGNOSIS — Z743 Need for continuous supervision: Secondary | ICD-10-CM | POA: Diagnosis not present

## 2021-09-28 DIAGNOSIS — L89159 Pressure ulcer of sacral region, unspecified stage: Secondary | ICD-10-CM | POA: Diagnosis not present

## 2021-09-28 DIAGNOSIS — D72823 Leukemoid reaction: Secondary | ICD-10-CM | POA: Diagnosis not present

## 2021-09-28 DIAGNOSIS — J9601 Acute respiratory failure with hypoxia: Secondary | ICD-10-CM | POA: Diagnosis not present

## 2021-09-28 DIAGNOSIS — D72829 Elevated white blood cell count, unspecified: Secondary | ICD-10-CM | POA: Diagnosis not present

## 2021-09-28 DIAGNOSIS — R279 Unspecified lack of coordination: Secondary | ICD-10-CM | POA: Diagnosis not present

## 2021-09-28 DIAGNOSIS — N3000 Acute cystitis without hematuria: Secondary | ICD-10-CM | POA: Diagnosis not present

## 2021-09-28 DIAGNOSIS — G9341 Metabolic encephalopathy: Secondary | ICD-10-CM | POA: Diagnosis not present

## 2021-09-28 DIAGNOSIS — R778 Other specified abnormalities of plasma proteins: Secondary | ICD-10-CM | POA: Diagnosis not present

## 2021-09-28 DIAGNOSIS — N390 Urinary tract infection, site not specified: Secondary | ICD-10-CM | POA: Diagnosis not present

## 2021-09-28 DIAGNOSIS — G8929 Other chronic pain: Secondary | ICD-10-CM | POA: Diagnosis not present

## 2021-10-01 DIAGNOSIS — C9110 Chronic lymphocytic leukemia of B-cell type not having achieved remission: Secondary | ICD-10-CM | POA: Diagnosis not present

## 2021-10-01 DIAGNOSIS — I5032 Chronic diastolic (congestive) heart failure: Secondary | ICD-10-CM | POA: Diagnosis not present

## 2021-10-01 DIAGNOSIS — N390 Urinary tract infection, site not specified: Secondary | ICD-10-CM | POA: Diagnosis not present

## 2021-10-02 DIAGNOSIS — I10 Essential (primary) hypertension: Secondary | ICD-10-CM | POA: Diagnosis not present

## 2021-10-04 DIAGNOSIS — G9341 Metabolic encephalopathy: Secondary | ICD-10-CM | POA: Diagnosis not present

## 2021-10-04 DIAGNOSIS — C9110 Chronic lymphocytic leukemia of B-cell type not having achieved remission: Secondary | ICD-10-CM | POA: Diagnosis not present

## 2021-10-04 DIAGNOSIS — I5032 Chronic diastolic (congestive) heart failure: Secondary | ICD-10-CM | POA: Diagnosis not present

## 2021-10-04 DIAGNOSIS — D72823 Leukemoid reaction: Secondary | ICD-10-CM | POA: Diagnosis not present

## 2021-10-08 DIAGNOSIS — N390 Urinary tract infection, site not specified: Secondary | ICD-10-CM | POA: Diagnosis not present

## 2021-10-08 DIAGNOSIS — L89159 Pressure ulcer of sacral region, unspecified stage: Secondary | ICD-10-CM | POA: Diagnosis not present

## 2021-10-08 DIAGNOSIS — C9110 Chronic lymphocytic leukemia of B-cell type not having achieved remission: Secondary | ICD-10-CM | POA: Diagnosis not present

## 2021-10-08 DIAGNOSIS — I10 Essential (primary) hypertension: Secondary | ICD-10-CM | POA: Diagnosis not present

## 2021-11-03 DEATH — deceased

## 2022-06-17 IMAGING — DX DG CHEST 2V
2 series · 2 of 2 positions shown · non-contrast
Comparison: 10/13/2020, 10/12/2020

CLINICAL DATA: 80-year-old female with congestive heart failure

EXAM:
CHEST - 2 VIEW

[dg chest 2 view (1 of 2)]
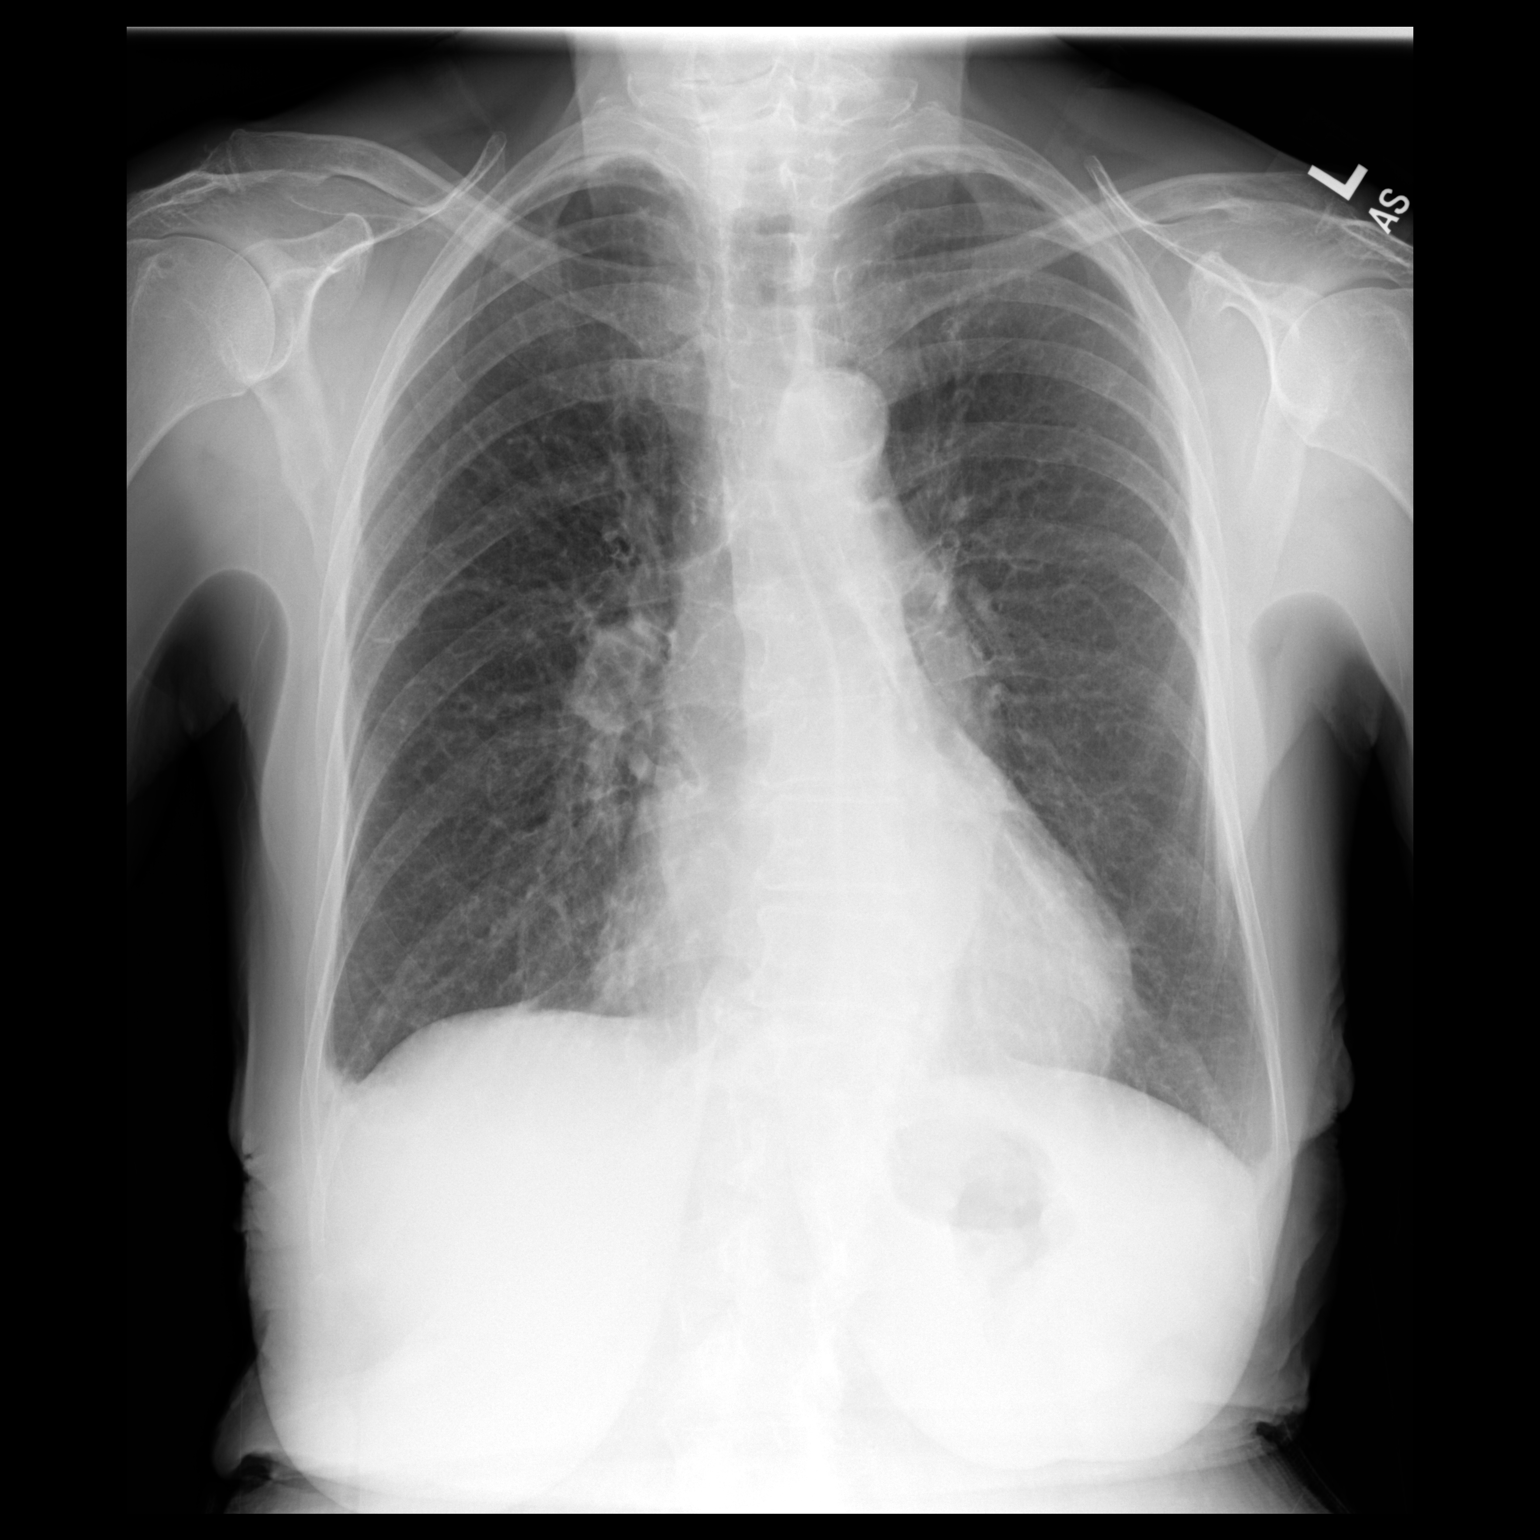

[dg chest 2 view (2 of 2)]
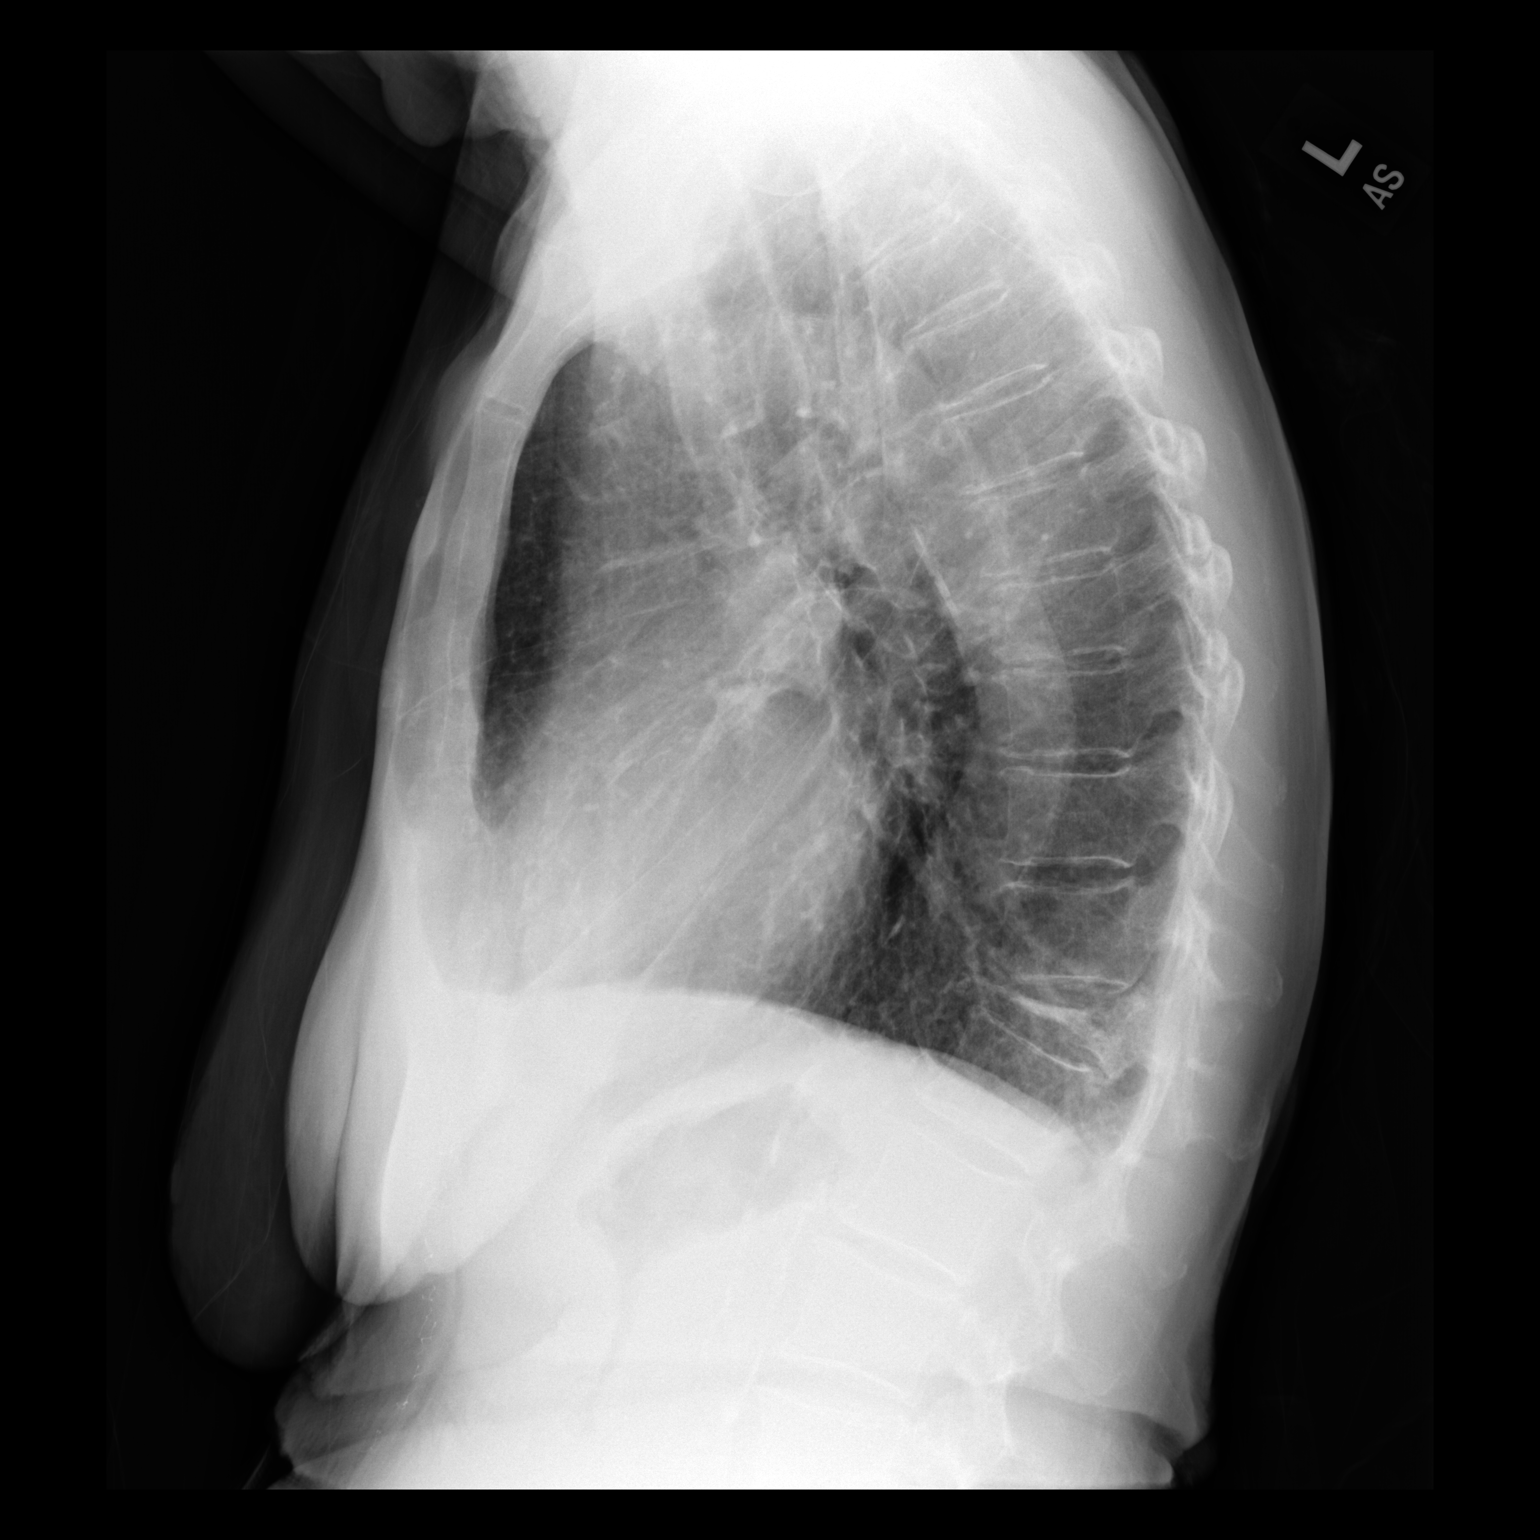

[2 of 2 positions shown; findings below may reference images not displayed]

FINDINGS: Cardiomediastinal silhouette unchanged in size and contour. No
evidence of central vascular congestion. No interlobular septal
thickening.

Stigmata of emphysema, with increased retrosternal airspace,
flattened hemidiaphragms, increased AP diameter, and hyperinflation
on the AP view.

No pneumothorax or pleural effusion. Coarsened interstitial
markings, with no confluent airspace disease.

No acute displaced fracture. Degenerative changes of the spine.

Scoliotic curvature of the spine again noted.

Redemonstration lower thoracic compression fracture, unchanged
IMPRESSION: No active cardiopulmonary disease.
